# Patient Record
Sex: Male | Born: 1970 | Race: Asian | Hispanic: No | Marital: Married | Smoking: Never smoker
Health system: Southern US, Community
[De-identification: ages and names within clinical notes are randomized; demographics above are authoritative.]

## PROBLEM LIST (undated history)

## (undated) DIAGNOSIS — G4730 Sleep apnea, unspecified: Secondary | ICD-10-CM

## (undated) DIAGNOSIS — J45909 Unspecified asthma, uncomplicated: Secondary | ICD-10-CM

## (undated) DIAGNOSIS — R7303 Prediabetes: Secondary | ICD-10-CM

## (undated) DIAGNOSIS — M1990 Unspecified osteoarthritis, unspecified site: Secondary | ICD-10-CM

## (undated) DIAGNOSIS — I10 Essential (primary) hypertension: Secondary | ICD-10-CM

## (undated) DIAGNOSIS — K279 Peptic ulcer, site unspecified, unspecified as acute or chronic, without hemorrhage or perforation: Secondary | ICD-10-CM

## (undated) DIAGNOSIS — E559 Vitamin D deficiency, unspecified: Secondary | ICD-10-CM

## (undated) DIAGNOSIS — E669 Obesity, unspecified: Secondary | ICD-10-CM

## (undated) DIAGNOSIS — E785 Hyperlipidemia, unspecified: Secondary | ICD-10-CM

## (undated) HISTORY — DX: Vitamin D deficiency, unspecified: E55.9

## (undated) HISTORY — DX: Hyperlipidemia, unspecified: E78.5

## (undated) HISTORY — DX: Unspecified osteoarthritis, unspecified site: M19.90

## (undated) HISTORY — DX: Prediabetes: R73.03

## (undated) HISTORY — PX: KNEE SURGERY: SHX244

---

## 2001-11-25 ENCOUNTER — Encounter: Payer: Self-pay | Admitting: Orthopedic Surgery

## 2001-11-25 ENCOUNTER — Ambulatory Visit (HOSPITAL_COMMUNITY): Admission: RE | Admit: 2001-11-25 | Discharge: 2001-11-25 | Payer: Self-pay | Admitting: Orthopedic Surgery

## 2003-09-14 ENCOUNTER — Inpatient Hospital Stay (HOSPITAL_COMMUNITY): Admission: EM | Admit: 2003-09-14 | Discharge: 2003-09-16 | Payer: Self-pay | Admitting: Emergency Medicine

## 2004-01-06 ENCOUNTER — Ambulatory Visit (HOSPITAL_COMMUNITY): Admission: RE | Admit: 2004-01-06 | Discharge: 2004-01-06 | Payer: Self-pay | Admitting: Gastroenterology

## 2004-01-06 ENCOUNTER — Encounter (INDEPENDENT_AMBULATORY_CARE_PROVIDER_SITE_OTHER): Payer: Self-pay | Admitting: *Deleted

## 2004-01-27 ENCOUNTER — Ambulatory Visit (HOSPITAL_BASED_OUTPATIENT_CLINIC_OR_DEPARTMENT_OTHER): Admission: RE | Admit: 2004-01-27 | Discharge: 2004-01-27 | Payer: Self-pay | Admitting: Orthopedic Surgery

## 2007-05-15 ENCOUNTER — Ambulatory Visit: Payer: Self-pay | Admitting: Family Medicine

## 2007-05-15 DIAGNOSIS — Z8669 Personal history of other diseases of the nervous system and sense organs: Secondary | ICD-10-CM | POA: Insufficient documentation

## 2007-05-15 DIAGNOSIS — Z9189 Other specified personal risk factors, not elsewhere classified: Secondary | ICD-10-CM | POA: Insufficient documentation

## 2007-05-15 DIAGNOSIS — G4730 Sleep apnea, unspecified: Secondary | ICD-10-CM | POA: Insufficient documentation

## 2007-05-15 DIAGNOSIS — J309 Allergic rhinitis, unspecified: Secondary | ICD-10-CM | POA: Insufficient documentation

## 2007-05-15 DIAGNOSIS — Z8711 Personal history of peptic ulcer disease: Secondary | ICD-10-CM

## 2007-05-15 DIAGNOSIS — J45909 Unspecified asthma, uncomplicated: Secondary | ICD-10-CM | POA: Insufficient documentation

## 2007-05-19 ENCOUNTER — Ambulatory Visit: Payer: Self-pay | Admitting: Family Medicine

## 2007-05-19 LAB — CONVERTED CEMR LAB
Blood in Urine, dipstick: NEGATIVE
Glucose, Urine, Semiquant: NEGATIVE
WBC Urine, dipstick: NEGATIVE
pH: 7

## 2007-05-23 ENCOUNTER — Ambulatory Visit: Payer: Self-pay | Admitting: Pulmonary Disease

## 2007-05-26 LAB — CONVERTED CEMR LAB
Basophils Absolute: 0 10*3/uL (ref 0.0–0.1)
Bilirubin, Direct: 0.1 mg/dL (ref 0.0–0.3)
Calcium: 9.5 mg/dL (ref 8.4–10.5)
Cholesterol: 228 mg/dL (ref 0–200)
GFR calc Af Amer: 88 mL/min
HCT: 44.5 % (ref 39.0–52.0)
Hemoglobin: 15.2 g/dL (ref 13.0–17.0)
MCHC: 34.1 g/dL (ref 30.0–36.0)
Monocytes Absolute: 0.5 10*3/uL (ref 0.1–1.0)
Neutro Abs: 3.1 10*3/uL (ref 1.4–7.7)
RDW: 12.9 % (ref 11.5–14.6)
Sodium: 143 meq/L (ref 135–145)
TSH: 1.37 microintl units/mL (ref 0.35–5.50)
Total Bilirubin: 1 mg/dL (ref 0.3–1.2)
Total Protein: 7.4 g/dL (ref 6.0–8.3)

## 2007-06-01 ENCOUNTER — Encounter: Payer: Self-pay | Admitting: Pulmonary Disease

## 2007-06-01 ENCOUNTER — Ambulatory Visit (HOSPITAL_BASED_OUTPATIENT_CLINIC_OR_DEPARTMENT_OTHER): Admission: RE | Admit: 2007-06-01 | Discharge: 2007-06-01 | Payer: Self-pay | Admitting: Pulmonary Disease

## 2007-06-12 ENCOUNTER — Ambulatory Visit: Payer: Self-pay | Admitting: Pulmonary Disease

## 2007-06-13 ENCOUNTER — Ambulatory Visit: Payer: Self-pay | Admitting: Pulmonary Disease

## 2007-07-11 ENCOUNTER — Ambulatory Visit: Payer: Self-pay | Admitting: Pulmonary Disease

## 2007-08-13 ENCOUNTER — Encounter: Payer: Self-pay | Admitting: Pulmonary Disease

## 2007-10-01 ENCOUNTER — Encounter: Payer: Self-pay | Admitting: Family Medicine

## 2008-03-31 ENCOUNTER — Encounter: Payer: Self-pay | Admitting: Family Medicine

## 2008-08-06 ENCOUNTER — Encounter: Payer: Self-pay | Admitting: Family Medicine

## 2008-12-01 ENCOUNTER — Ambulatory Visit: Payer: Self-pay | Admitting: Family Medicine

## 2008-12-01 DIAGNOSIS — R51 Headache: Secondary | ICD-10-CM

## 2008-12-01 DIAGNOSIS — R519 Headache, unspecified: Secondary | ICD-10-CM | POA: Insufficient documentation

## 2008-12-05 ENCOUNTER — Emergency Department (HOSPITAL_COMMUNITY): Admission: EM | Admit: 2008-12-05 | Discharge: 2008-12-06 | Payer: Self-pay | Admitting: Emergency Medicine

## 2008-12-07 ENCOUNTER — Ambulatory Visit: Payer: Self-pay | Admitting: Family Medicine

## 2008-12-07 DIAGNOSIS — K2900 Acute gastritis without bleeding: Secondary | ICD-10-CM | POA: Insufficient documentation

## 2008-12-10 ENCOUNTER — Encounter (INDEPENDENT_AMBULATORY_CARE_PROVIDER_SITE_OTHER): Payer: Self-pay | Admitting: *Deleted

## 2009-01-28 ENCOUNTER — Encounter: Payer: Self-pay | Admitting: Family Medicine

## 2009-02-01 ENCOUNTER — Encounter: Payer: Self-pay | Admitting: Family Medicine

## 2010-02-21 NOTE — Letter (Signed)
Summary: [facility]  [facility]   Imported By: Maryln Gottron 02/21/2009 10:26:33  _____________________________________________________________________  External Attachment:    Type:   Image     Comment:   External Document

## 2010-04-26 LAB — COMPREHENSIVE METABOLIC PANEL
ALT: 40 U/L (ref 0–53)
AST: 30 U/L (ref 0–37)
Albumin: 4 g/dL (ref 3.5–5.2)
CO2: 28 mEq/L (ref 19–32)
Chloride: 107 mEq/L (ref 96–112)
GFR calc Af Amer: 41 mL/min — ABNORMAL LOW (ref >60)
GFR calc non Af Amer: 34 mL/min — ABNORMAL LOW (ref >60)
Potassium: 3.2 mEq/L — ABNORMAL LOW (ref 3.5–5.1)
Sodium: 141 mEq/L (ref 135–145)
Total Bilirubin: 0.6 mg/dL (ref 0.3–1.2)

## 2010-04-26 LAB — LIPASE, BLOOD: Lipase: 31 U/L (ref 11–59)

## 2010-04-26 LAB — URINALYSIS, ROUTINE W REFLEX MICROSCOPIC
Bilirubin Urine: NEGATIVE
Ketones, ur: NEGATIVE mg/dL
Nitrite: NEGATIVE
Protein, ur: NEGATIVE mg/dL
Specific Gravity, Urine: 1.008 (ref 1.005–1.030)
Urobilinogen, UA: 0.2 mg/dL (ref 0.0–1.0)

## 2010-04-26 LAB — CBC
Hemoglobin: 14.3 g/dL (ref 13.0–17.0)
RDW: 14 % (ref 11.5–15.5)

## 2010-04-26 LAB — DIFFERENTIAL
Basophils Absolute: 0 10*3/uL (ref 0.0–0.1)
Lymphocytes Relative: 29 % (ref 12–46)
Monocytes Absolute: 0.9 10*3/uL (ref 0.1–1.0)
Neutro Abs: 5.8 10*3/uL (ref 1.7–7.7)
Neutrophils Relative %: 60 % (ref 43–77)

## 2010-04-26 LAB — HEMOCCULT GUIAC POC 1CARD (OFFICE): Fecal Occult Bld: NEGATIVE

## 2010-04-26 LAB — URINE CULTURE

## 2010-04-26 LAB — PROTIME-INR
INR: 1.04 (ref 0.00–1.49)
Prothrombin Time: 13.5 seconds (ref 11.6–15.2)

## 2010-04-26 LAB — APTT: aPTT: 31 seconds (ref 24–37)

## 2010-06-06 NOTE — Procedures (Signed)
[name], Stephen Webster NO.:  [id number]   MEDICAL RECORD NO.:  [id number]          PATIENT TYPE:  OUT   LOCATION:  SLEEP CENTER                 FACILITY:  [facility]   PHYSICIAN:  Barbaraann Share, MD,FCCPDATE OF BIRTH:  Dec 09[birth year] 1969   DATE OF STUDY:  06/01/2007                            NOCTURNAL POLYSOMNOGRAM   REFERRING PHYSICIAN:  Barbaraann Share, MD,FCCP   INDICATION FOR STUDY:  Hypersomnia with sleep apnea.   EPWORTH SLEEPINESS SCORE:  7.   MEDICATIONS:   SLEEP ARCHITECTURE:  The patient had a total sleep time of 365 minutes,  with no slow-wave sleep, and decreased quantities of REM.  Sleep onset  latency was prolonged at 66 minutes, and REM onset was normal.  Sleep  efficiency was decreased at 82%.   RESPIRATORY DATA:  The patient was found to have 563 apneas and 80  hypopneas, for an apnea/hypopnea index of 106 events/hour.  The events  were not positional, and there was very loud snoring noted throughout.   OXYGEN DATA:  The patient had O2 desaturation as low as 59%, with the  worst being during his REM events.  The patient spent 193 minutes less  than 88% saturation during the study.   CARDIAC DATA:  No clinically significant arrhythmias were noted.   MOVEMENT-PARASOMNIA:  The patient had no significant leg jerks or  abnormal behaviors.   IMPRESSIONS-RECOMMENDATIONS:  Very severe obstructive sleep  apnea/hypopnea syndrome, with an apnea/hypopnea index of 106 events/hour  and O2 desaturation as low as 59%.  Treatment for this degree of sleep  apnea should focus primarily on weight loss as well as CPAP.      Barbaraann Share, MD,FCCP  Diplomate, American Board of Sleep  Medicine  Electronically Signed     KMC/MEDQ  D:  06/13/2007 06:12:20  T:  06/13/2007 07:17:56  Job:  [id number]

## 2010-06-09 NOTE — Discharge Summary (Signed)
Stephen Webster, Stephen Webster                           ACCOUNT NO.:  [id number]   MEDICAL RECORD NO.:  [id number]                   PATIENT TYPE:  INP   LOCATION:  5741                                 FACILITY:  [facility]   PHYSICIAN:  Melissa L. Ladona Ridgel, MD               DATE OF BIRTH:  1970-10-02   DATE OF ADMISSION:  09/14/2003  DATE OF DISCHARGE:  09/16/2003                                 DISCHARGE SUMMARY   DISCHARGE DIAGNOSES:  1. Upper gastrointestinal bleed secondary to a CLO-positive ulcer noted on     esophagogastroduodenoscopy.  2. Slight anemia related to #1.  3. Possible sleep apnea as a source for his repeated headaches.   DISCHARGE MEDICATIONS:  1. Protonix 40 mg p.o. b.i.d. x 10 days and then decrease this down to q.d.  2. Biaxin 500 mg p.o. b.i.d. x 10 days.  3. Amoxicillin 1 gm p.o. b.i.d.   SPECIAL INSTRUCTIONS:  The patient is to locate a primary care physician  through his insurance company to arrange for a follow-up visit and possible  sleep study to rule out sleep apnea as his source for chronic headache.   FOLLOW UP:  The patient is to follow up with Dr. Bernette Redbird on September 28, 2003.   LABORATORY DATA:  CT of the head is within normal limits.   HISTORY OF PRESENT ILLNESS:  The patient is a 40 year old [place] male who  presented to the hospital on the day of admission with symptoms of nausea,  vomiting of coffee ground material, and dark stools, as well as left  abdominal pain.  The patient was seen in the emergency room by GI and  underwent EGD at which time was found to have a nonbleeding ulcer located in  the antrum.  Since there was no active bleeding, no intervention was  undertaken.   The patient was monitored overnight in the stepdown unit and treated with  Protonix as well as sucralfate.  His hemoglobin remained stable throughout  the course of the hospitalization.  He was able to resume p.o. intake  without nausea, vomiting, or abdominal  pain.   The CLO-test performed on the ulcer came back positive and he was therefore  started on antibacterial therapy for H. pylori.   PHYSICAL EXAMINATION:  VITAL SIGNS:  On the day of discharge, the patient's  vital signs were stable.  Blood pressure was 100/57, pulse 77, respirations  16, temperature 98 with 99% on room air.  GENERAL:  His general exam revealed no acute distress.  HEENT:  Pupils are equal, round, and reactive to light.  Extraocular  movements intact.  Moist mucus membranes.  NECK:  Supple.  There was no JVD, no lymph nodes, and no carotid bruits.  CHEST:  Clear to auscultation.  CARDIOVASCULAR:  Regular rate and rhythm.  Positive S1 and S2.  No S3, S4,  murmurs, rubs, or gallops.  ABDOMEN:  Soft, nontender, and nondistended with positive bowel sounds.  EXTREMITIES:  No clubbing, cyanosis, or edema.  NEUROLOGICAL:  He was nonfocal.   HOSPITAL COURSE:  The patient had described frequent headaches, temporal in  nature.  No evidence for muscle spasm in the neck. He described these as  being daily for which he had been taking Naprosyn which may have contributed  to this GI bleed.  Because of this complaint, we did take a CAT scan of his  head which was found to be within normal limits. Of note, during his  hospitalization, the patient was noted to have signs and symptoms of sleep  apnea and therefore was recommended that he follow up with a primary care  physician to have a sleep study.  This may be contributing to his daily  headaches.   Pertinent laboratory values reveal a discharge hemoglobin and hematocrit of  9.5 and 28.1 which was stable over 48 hours. His BUN and creatinine were  stable at 13 and 1.2.  Also done during this hospitalization were liver  enzymes which were within normal limits.   The patient was found to be stable for discharge and requested to follow up  with a primary care physician as well as Dr. Bernette Redbird.                                                 Melissa L. Ladona Ridgel, MD    MLT/MEDQ  D:  09/17/2003  T:  09/17/2003  Job:  [id number]   cc:   Bernette Redbird, M.D.  [address] [place]  [place], [place] [address]  Fax: [phone]

## 2010-06-09 NOTE — Op Note (Signed)
NAMEZAYVEN, Stephen Webster               ACCOUNT NO.:  [id number]   MEDICAL RECORD NO.:  [id number]          PATIENT TYPE:  AMB   LOCATION:  ENDO                         FACILITY:  [facility]   PHYSICIAN:  Bernette Redbird, M.D.   DATE OF BIRTH:  Jun 07, 1970   DATE OF PROCEDURE:  DATE OF DISCHARGE:                                 OPERATIVE REPORT   DATE OF PROCEDURE:  January 06, 2004.   PROCEDURE:  Upper endoscopy with biopsies.   INDICATION:  A 40 year old gentleman now four months status post an upper GI  bleed from a gastric ulcer.  At that time, he had been on NSAIDs, but was  also H. pylori positive, and he has been treated for the H. pylori infection  since that time.   ENDOSCOPIST:  Bernette Redbird, MD.   FINDINGS:  Resolution of gastric ulcer.   PROCEDURE:  The nature, purpose, and risks of the procedure were familiar to  the patient from prior examination, and he provided written consent.  Sedation was Fentanyl 60 mcg and Versed 6 mg IV with some desaturation  initially related to snoring and probably some upper airway obstruction,  which responded to positioning of his head and lifting his chin.  The  Olympus small-caliber adult video endoscope was passed under direct vision.  The vocal cords were unremarkable.  The esophagus was readily entered and  had normal mucosa without evidence of reflux esophagitis, Barrett's  esophagus, varices, infection, neoplasia, Mallory-Weiss tear, or any ring  stricture or significant hiatal hernia.   The stomach contained no significant residual.  In the antral region was a  patch of punctate erythema probably responding to the previous ulcer site,  which was now completely healed.  No ulcers, erosions, polyps, or masses  were observed in the stomach including on a retroflexed view of the proximal  stomach, and the pylorus, duodenal bulb, and second duodenum were  unremarkable.   Antral biopsies were obtained to look for evidence of residual H.  pylori  infection status post treatment, and the scope was then removed from the  patient, who tolerated the procedure well and without apparent complication.   IMPRESSION:  Resolution of gastric ulcer (531.00).   PLAN:  Await pathology on biopsies looking for H. pylori infection and  consider re-treatment if positive.  Okay to remain off PPI therapy, unless  he goes back on aspirin or NSAIDs in the future for some reason.       RB/MEDQ  D:  01/06/2004  T:  01/06/2004  Job:  [id number]   cc:   [facility]

## 2010-06-09 NOTE — Consult Note (Signed)
Stephen, Webster NO.:  [id number]   MEDICAL RECORD NO.:  [id number]                   PATIENT TYPE:  INP   LOCATION:  1825                                 FACILITY:  [facility]   PHYSICIAN:  Stephen Webster, M.D.                DATE OF BIRTH:  [birth year]   DATE OF CONSULTATION:  09/14/2003  DATE OF DISCHARGE:                                   CONSULTATION   REASON FOR CONSULTATION:  Stephen Webster of the [facility] Service asked  me to see this 40 year old, unassigned patient because of GI bleeding.   HISTORY OF PRESENT ILLNESS:  Stephen Webster is a Stephen Webster.  He is originally from [place] himself.  He has no prior  history of ulcer disease or GI bleeding.  A couple of weeks ago he was  started on Naprosyn, I believe by the [facility], for headaches.  Shortly thereafter, he began to notice dark stools and some left perigastric  abdominal pain.  Today, he had coffee ground emesis apparently admixed with  some actual red blood.  He presented to the emergency room where he was  noted to have a systolic blood pressure in the 80s, but this came up nicely  with fluids.  His initial hemoglobin is 12.8.  In view of the findings,  arrangements were made for inpatient care on the hospitalist service and I  was asked to see the patient consultatively.   PAST MEDICAL HISTORY:  1. No known allergies.  2. No chronic medications (recently on Naprosyn per HPI).  3. Operations:  None.  4. Chronic medical illnesses:  None.   FAMILY HISTORY:  Negative for GI illnesses.   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   REVIEW OF SYMPTOMS:  Negative for chronic GI symptoms, specifically no  antecedent anorexia, weight loss, chronic reflux, dysphagia, abdominal pain,  or bowel habit problems.   PHYSICAL EXAMINATION:  GENERAL:  This is a stocky, pleasant,  articulate,  Asian gentleman who speaks good [place].  VITAL SIGNS:  Vital signs at this  time are unremarkable with pulse around 99 and normal blood pressure.  HEENT:  He is anicteric and has perhaps a hint of skin pallor.  CHEST:  Clear.  HEART: Normal.  ABDOMEN:  Without evident mass or tenderness.   LABORATORY DATA:  Pertinent for initial normal hemoglobin of 12.8.  Elevated  BUN 45 with creatinine 1.2.  Normal liver chemistries, lipase, platelets,  and INR.   IMPRESSION:  1. Hematemesis with mild hemodynamic instability but so far, no post     hemorrhagic anemia.  2. Antecedent melenic stools suggesting some low-grade gastrointestinal     bleeding prior to today.  3. Nonsteroidal anti-inflammatory drugs exposure, presumably accounting for     the gastrointestinal bleed.   PLAN:  Endoscopic evaluation.  The nature, purpose, and risks of the  procedure were reviewed with the patient.  We will proceed with it this  evening so as to determine, as early as possible, whether the patient is  bleeding or at significant risk for further bleeding.  Further management  would depend in part on the endoscopic findings.                                               Stephen Webster, M.D.    RB/MEDQ  D:  09/14/2003  T:  09/15/2003  Job:  [id number]   cc:   [facility]

## 2010-06-09 NOTE — Op Note (Signed)
NAMESUFIAN, Stephen Webster NO.:  [id number]   MEDICAL RECORD NO.:  [id number]                   PATIENT TYPE:  INP   LOCATION:  1825                                 FACILITY:  [facility]   PHYSICIAN:  Bernette Redbird, M.D.                DATE OF BIRTH:  1970/09/21   DATE OF PROCEDURE:  09/14/2003  DATE OF DISCHARGE:                                 OPERATIVE REPORT   PROCEDURE:  Upper endoscopy.   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  Shortly thereafter he began to have dark stools, and today  he had coffee-grounds emesis and hematemesis and presented to the emergency  room with a systolic blood pressure of approximately 80, which came up  nicely with fluids.   FINDINGS:  Antral ulcer without active bleeding at the time of the exam.   PROCEDURE:  The nature, purpose, and risks of the procedure had been  discussed with the patient, who provided written consent.  The procedure was  done at the bedside in the emergency room.  Sedation was fentanyl 50 mcg and  Versed 5 mg IV without arrhythmias or desaturation.  The Olympus video  endoscope was passed under direct vision.  The vocal cords looked normal.  There was a little bit of thickening of the posterior commissure of the  larynx, which is of uncertain clinical significance.   The esophagus was readily entered and was normal in its entirety without  evidence of reflux esophagitis, Barrett's esophagus, varices, infection,  neoplasia, or any ring, stricture, or hiatal hernia.   The stomach contained a small bilious residual, no blood or coffee-grounds  material whatsoever.   The antrum of the stomach had a moderately large benign-appearing ulcer  measuring about 3 x 4 cm in size.  The margin of the ulcer was somewhat  edematous but not really heaped up.  There was no significant mass effect.  The base of the ulcer had a slightly dirty appearance  consistent with the  stigma of recent hemorrhage, but there was no discrete visible vessel and  certainly no active bleeding or adherent clot.  The remainder of the stomach  was normal, including a retroflexed view of the proximal stomach.  Similarly, the pylorus, duodenal bulb, and second duodenum looked normal.   Several antral biopsies were obtained for CLOtesting prior to removal of the  scope, but I did not biopsy the ulcer in view of the recent bleeding.   The patient tolerated the procedure well, and there were no apparent  complications.   IMPRESSION:  1. No active bleeding or blood in the stomach at the time of this     examination.  2. Moderately large, benign-appearing gastric antral ulcer consistent with     nonsteroidal anti-inflammatory drug exposure, consistent with recent     hemorrhage but no visible vessel, therefore no intervention performed     (  531.00).   RECOMMENDATIONS:  I have discussed the findings with Dr. Tresa Endo of the  [facility].  I would favor aggressive antipeptic therapy,  awaiting CLOtest results, treating the CLOtest if it is positive for H.  pylori infection, and performing repeat endoscopy in a couple of months to  confirm healing.                                               Bernette Redbird, M.D.    RB/MEDQ  D:  09/14/2003  T:  09/15/2003  Job:  [id number]   cc:   [facility]

## 2010-06-09 NOTE — Op Note (Signed)
Stephen Webster, Stephen Webster               ACCOUNT NO.:  [id number]   MEDICAL RECORD NO.:  [id number]          PATIENT TYPE:  AMB   LOCATION:  DSC                          FACILITY:  [facility]   PHYSICIAN:  Loreta Ave, M.D. DATE OF BIRTH:  02/28/1970   DATE OF PROCEDURE:  01/27/2004  DATE OF DISCHARGE:                                 OPERATIVE REPORT   PREOPERATIVE DIAGNOSES:  Right knee loose body.   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   OPERATION PERFORMED:  Right knee examination under anesthesia and  arthroscopy with extensive lysis and debridement of adhesions and reactive  synovitis.   SURGEON:  Loreta Ave, M.D.   ASSISTANT:  Genene Churn. Denton Meek.   ANESTHESIA:  General.   ESTIMATED BLOOD LOSS:  Minimal.   TOURNIQUET:  Not employed.   SPECIMENS:  None.   CULTURES:  None.   COMPLICATIONS:  None.   DRESSING:  Soft compressive.   DESCRIPTION OF PROCEDURE:  The patient was brought to the operating room and  after adequate anesthesia had been obtained, the right knee examined.  Full  motion.  Stable ligaments, but tethering of the patellofemoral joint  relatively globally which turned out to be from adhesions.  Tourniquet and  leg holder applied.  Leg prepped and draped in the usual sterile fashion.  Three portals were created, one superolateral, one each medial and lateral  parapatellar.  Inflow catheter introduced.  Knee distended.  Arthroscope  introduced, knee inspected.  Articular cartilage intact.  Tethering  globally, patellofemoral joint because of extensive adhesions anteriorly.  Area on MRI  suggesting loose bodies, turned out to be marked adhesions  within the notch.  All of the reactive synovitis and adhesions debrided.  Where they extended over into the medial and lateral gutter where they could  cause mechanical symptoms on the margin of the  condyle, they were all  removed re-establishing the medial and lateral gutters.  Excellent look in  all gutters and all recesses.  Cruciate ligaments intact and no loose bodies  seen.  No chondral defects which would cause loose bodies either.  Medial  and lateral meniscus were intact.  At completion, better  patellofemoral mobility and no tethering.  Entire knee examined, no other  findings appreciated.  Instruments and fluid removed.  Portals of the knee  injected with Marcaine.  Portals closed with 4-0 nylon.  Sterile compressive  dressing applied.  Anesthesia reversed.  Brought to recovery room.  Tolerated surgery well.  No complications.      [name]   DFM/MEDQ  D:  01/27/2004  T:  01/27/2004  Job:  [id number]

## 2010-08-16 ENCOUNTER — Other Ambulatory Visit: Payer: Self-pay | Admitting: Gastroenterology

## 2010-08-18 ENCOUNTER — Ambulatory Visit
Admission: RE | Admit: 2010-08-18 | Discharge: 2010-08-18 | Disposition: A | Discharge status: Home / Self Care | Payer: BC Managed Care – PPO | Source: Ambulatory Visit | Attending: Gastroenterology | Admitting: Gastroenterology

## 2011-07-05 ENCOUNTER — Emergency Department (HOSPITAL_COMMUNITY): Payer: BC Managed Care – PPO

## 2011-07-05 ENCOUNTER — Encounter (HOSPITAL_COMMUNITY): Payer: Self-pay | Admitting: Emergency Medicine

## 2011-07-05 ENCOUNTER — Emergency Department (HOSPITAL_COMMUNITY)
Admission: EM | Admit: 2011-07-05 | Discharge: 2011-07-06 | Disposition: A | Discharge status: Home / Self Care | Payer: BC Managed Care – PPO | Attending: Emergency Medicine | Admitting: Emergency Medicine

## 2011-07-05 DIAGNOSIS — M545 Low back pain, unspecified: Secondary | ICD-10-CM | POA: Insufficient documentation

## 2011-07-05 DIAGNOSIS — IMO0002 Reserved for concepts with insufficient information to code with codable children: Secondary | ICD-10-CM | POA: Insufficient documentation

## 2011-07-05 DIAGNOSIS — T148XXA Other injury of unspecified body region, initial encounter: Secondary | ICD-10-CM

## 2011-07-05 DIAGNOSIS — Z79899 Other long term (current) drug therapy: Secondary | ICD-10-CM | POA: Insufficient documentation

## 2011-07-05 DIAGNOSIS — Y92009 Unspecified place in unspecified non-institutional (private) residence as the place of occurrence of the external cause: Secondary | ICD-10-CM | POA: Insufficient documentation

## 2011-07-05 DIAGNOSIS — M549 Dorsalgia, unspecified: Secondary | ICD-10-CM

## 2011-07-05 DIAGNOSIS — I10 Essential (primary) hypertension: Secondary | ICD-10-CM | POA: Insufficient documentation

## 2011-07-05 DIAGNOSIS — X500XXA Overexertion from strenuous movement or load, initial encounter: Secondary | ICD-10-CM | POA: Insufficient documentation

## 2011-07-05 DIAGNOSIS — E669 Obesity, unspecified: Secondary | ICD-10-CM | POA: Insufficient documentation

## 2011-07-05 HISTORY — DX: Essential (primary) hypertension: I10

## 2011-07-05 HISTORY — DX: Sleep apnea, unspecified: G47.30

## 2011-07-05 HISTORY — DX: Obesity, unspecified: E66.9

## 2011-07-05 HISTORY — DX: Peptic ulcer, site unspecified, unspecified as acute or chronic, without hemorrhage or perforation: K27.9

## 2011-07-05 NOTE — ED Notes (Signed)
PT. REPORTS LOW BACK PAIN ONSET LAST WEEK AFTER LIFTING HIS DAUGHTER , PAIN WORSE WITH MOVEMENT AND CERTAIN POSITIONS .

## 2011-07-06 MED ORDER — OXYCODONE-ACETAMINOPHEN 5-325 MG PO TABS: 1.0000 | ORAL_TABLET | Freq: Four times a day (QID) | ORAL | Status: AC | PRN

## 2011-07-06 MED ORDER — DIAZEPAM 5 MG PO TABS
5.0000 mg | ORAL_TABLET | Freq: Four times a day (QID) | ORAL | Status: AC | PRN
Start: 2011-07-06 — End: 2011-07-16

## 2011-07-06 NOTE — Discharge Instructions (Signed)
You were seen and evaluated for your complaints of low back pains and soreness. Your x-rays today did not show any signs for broken bones or other concerning injuries. At this time your providers that he may return home and followup to primary care provider. It is recommended that he discuss with your primary care provider and need for further evaluation such as MRI testing. If you develop any worsening symptoms, severe pain, weakness in your legs, loss of control of your bowels or bladder, difficulty urinating or numbness in your groin return to emergency room.   Back Pain, Adult Low back pain is very common. About 1 in 5 people have back pain.The cause of low back pain is rarely dangerous. The pain often gets better over time.About half of people with a sudden onset of back pain feel better in just 2 weeks. About 8 in 10 people feel better by 6 weeks.  CAUSES Some common causes of back pain include:  Strain of the muscles or ligaments supporting the spine.   Wear and tear (degeneration) of the spinal discs.   Arthritis.   Direct injury to the back.  DIAGNOSIS Most of the time, the direct cause of low back pain is not known.However, back pain can be treated effectively even when the exact cause of the pain is unknown.Answering your caregiver's questions about your overall health and symptoms is one of the most accurate ways to make sure the cause of your pain is not dangerous. If your caregiver needs more information, he or she may order lab work or imaging tests (X-rays or MRIs).However, even if imaging tests show changes in your back, this usually does not require surgery. HOME CARE INSTRUCTIONS For many people, back pain returns.Since low back pain is rarely dangerous, it is often a condition that people can learn to [facility] their own.   Remain active. It is stressful on the back to sit or stand in one place. Do not sit, drive, or stand in one place for more than 30 minutes at a time.  Take short walks on level surfaces as soon as pain allows.Try to increase the length of time you walk each day.   Do not stay in bed.Resting more than 1 or 2 days can delay your recovery.   Do not avoid exercise or work.Your body is made to move.It is not dangerous to be active, even though your back may hurt.Your back will likely heal faster if you return to being active before your pain is gone.   Pay attention to your body when you bend and lift. Many people have less discomfortwhen lifting if they bend their knees, keep the load close to their bodies,and avoid twisting. Often, the most comfortable positions are those that put less stress on your recovering back.   Find a comfortable position to sleep. Use a firm mattress and lie on your side with your knees slightly bent. If you lie on your back, put a pillow under your knees.   Only take over-the-counter or prescription medicines as directed by your caregiver. Over-the-counter medicines to reduce pain and inflammation are often the most helpful.Your caregiver may prescribe muscle relaxant drugs.These medicines help dull your pain so you can more quickly return to your normal activities and healthy exercise.   Put ice on the injured area.   Put ice in a plastic bag.   Place a towel between your skin and the bag.   Leave the ice on for 15 to 20 minutes, 3 to  4 times a day for the first 2 to 3 days. After that, ice and heat may be alternated to reduce pain and spasms.   Ask your caregiver about trying back exercises and gentle massage. This may be of some benefit.   Avoid feeling anxious or stressed.Stress increases muscle tension and can worsen back pain.It is important to recognize when you are anxious or stressed and learn ways to manage it.Exercise is a great option.  SEEK MEDICAL CARE IF:  You have pain that is not relieved with rest or medicine.   You have pain that does not improve in 1 week.   You have new  symptoms.   You are generally not feeling well.  SEEK IMMEDIATE MEDICAL CARE IF:   You have pain that radiates from your back into your legs.   You develop new bowel or bladder control problems.   You have unusual weakness or numbness in your arms or legs.   You develop nausea or vomiting.   You develop abdominal pain.   You feel faint.  Document Released: 01/08/2005 Document Revised: 12/28/2010 Document Reviewed: 05/29/2010 [facility] Patient Information 2012 [place], [place].     Back Exercises Back exercises help treat and prevent back injuries. The goal of back exercises is to increase the strength of your abdominal and back muscles and the flexibility of your back. These exercises should be started when you no longer have back pain. Back exercises include:  Pelvic Tilt. Lie on your back with your knees bent. Tilt your pelvis until the lower part of your back is against the floor. Hold this position 5 to 10 sec and repeat 5 to 10 times.   Knee to Chest. Pull first 1 knee up against your chest and hold for 20 to 30 seconds, repeat this with the other knee, and then both knees. This may be done with the other leg straight or bent, whichever feels better.   Sit-Ups or Curl-Ups. Bend your knees 90 degrees. Start with tilting your pelvis, and do a partial, slow sit-up, lifting your trunk only 30 to 45 degrees off the floor. Take at least 2 to 3 seconds for each sit-up. Do not do sit-ups with your knees out straight. If partial sit-ups are difficult, simply do the above but with only tightening your abdominal muscles and holding it as directed.   Hip-Lift. Lie on your back with your knees flexed 90 degrees. Push down with your feet and shoulders as you raise your hips a couple inches off the floor; hold for 10 seconds, repeat 5 to 10 times.   Back arches. Lie on your stomach, propping yourself up on bent elbows. Slowly press on your hands, causing an arch in your low back. Repeat 3 to 5  times. Any initial stiffness and discomfort should lessen with repetition over time.   Shoulder-Lifts. Lie face down with arms beside your body. Keep hips and torso pressed to floor as you slowly lift your head and shoulders off the floor.  Do not overdo your exercises, especially in the beginning. Exercises may cause you some mild back discomfort which lasts for a few minutes; however, if the pain is more severe, or lasts for more than 15 minutes, do not continue exercises until you see your caregiver. Improvement with exercise therapy for back problems is slow.  See your caregivers for assistance with developing a proper back exercise program. Document Released: 02/16/2004 Document Revised: 12/28/2010 Document Reviewed: 01/08/2005 [facility] Patient Information 2012 [place], [place].

## 2011-07-06 NOTE — ED Provider Notes (Signed)
History     CSN: [id number]  Arrival date & time 07/05/11  2101   First MD Initiated Contact with Patient 07/05/11 2301      Chief Complaint  Patient presents with  . Back Pain   HPI  History provided by the patient. Patient is a 41 year old male with history of hypertension and peptic ulcer disease who presents with complaints of low back pain and strain. Patient states that he was lifting his daughter last week and carry her from the car to the garage after returning home and after sitting her down the ground felt some soreness in his low back area. Symptoms persisted with waxing waning course for a few days but have become much more steady and persistent. Patient has been trying to use heat over the area as well as over-the-counter pain medications without significant relief. Tonight pain became much more severe with any positions or movements. Patient states he has slept poorly last few nights and especially this night. Pain does radiate some into the buttocks. Patient has no other complaints or symptoms. Denies any numbness weakness in lower extremities. Denies any urinary or fecal incontinence, urinary retention or perineal numbness.   Past Medical History  Diagnosis Date  . Hypertension   . PUD (peptic ulcer disease)   . Sleep apnea   . Obesity     Past Surgical History  Procedure Date  . Knee surgery     No family history on file.  History  Substance Use Topics  . Smoking status: Never Smoker   . Smokeless tobacco: Not on file  . Alcohol Use: No      Review of Systems  Constitutional: Negative for fever and chills.  Genitourinary: Negative for dysuria, frequency, hematuria, flank pain and difficulty urinating.  Musculoskeletal: Positive for back pain.  Neurological: Negative for weakness and numbness.    Allergies  Aspirin and Nsaids  Home Medications   Current Outpatient Rx  Name Route Sig Dispense Refill  . LEVOCETIRIZINE DIHYDROCHLORIDE 5 MG PO TABS  Oral Take 5 mg by mouth every evening.    Marland Kitchen LORATADINE 10 MG PO TABS Oral Take 10 mg by mouth daily.      BP 133/77  Pulse 100  Temp 98.2 F (36.8 C) (Oral)  Resp 18  SpO2 94%  Physical Exam  Nursing note and vitals reviewed. Constitutional: He is oriented to person, place, and time. He appears well-developed and well-nourished. No distress.  HENT:  Head: Normocephalic.  Cardiovascular: Normal rate and regular rhythm.   Pulmonary/Chest: Effort normal and breath sounds normal. No respiratory distress. He has no wheezes. He has no rales.  Abdominal: Soft. He exhibits no distension. There is no tenderness. There is no rebound and no guarding.       No CVA tenderness  Musculoskeletal:       Cervical back: Normal.       Thoracic back: Normal.       Lumbar back: He exhibits tenderness. He exhibits no bony tenderness.       Back:  Neurological: He is alert and oriented to person, place, and time. Gait normal.  Skin: Skin is warm. No rash noted.  Psychiatric: He has a normal mood and affect. His behavior is normal.    ED Course  Procedures   Dg Lumbar Spine Complete  07/05/2011  *RADIOLOGY REPORT*  Clinical Data: Low back pain, lifting injury  LUMBAR SPINE - COMPLETE 4+ VIEW  Comparison: 12/06/2008  Findings: Normal alignment.  No compression  fracture.  Preserved vertebral body heights and disc spaces.  No significant degenerative process or spondylosis.  Normal SI joints.  Intact pedicles.  IMPRESSION: No acute finding  Original Report Authenticated By: Judie Petit. Ruel Favors, M.D.     1. Back pain   2. Muscle strain       MDM  Patient seen and evaluated. Patient no acute distress. Patient with no concerning or retroflex symptoms for back pain. Patient has had a baseline chronic back pain with flareup after lifting daughter        Angus Seller, [place] 07/06/11 [phone]

## 2011-07-08 NOTE — ED Provider Notes (Signed)
Medical screening examination/treatment/procedure(s) were performed by non-physician practitioner and as supervising physician I was immediately available for consultation/collaboration.  Lolamae Voisin, MD 07/08/11 1932 

## 2012-05-19 ENCOUNTER — Ambulatory Visit (HOSPITAL_COMMUNITY)
Admission: RE | Admit: 2012-05-19 | Discharge: 2012-05-19 | Disposition: A | Discharge status: Home / Self Care | Payer: BC Managed Care – PPO | Source: Ambulatory Visit | Attending: Internal Medicine | Admitting: Internal Medicine

## 2012-05-19 ENCOUNTER — Other Ambulatory Visit (HOSPITAL_COMMUNITY): Payer: Self-pay | Admitting: Internal Medicine

## 2012-05-19 DIAGNOSIS — M2560 Stiffness of unspecified joint, not elsewhere classified: Secondary | ICD-10-CM | POA: Insufficient documentation

## 2012-05-19 DIAGNOSIS — S139XXA Sprain of joints and ligaments of unspecified parts of neck, initial encounter: Secondary | ICD-10-CM

## 2012-05-19 DIAGNOSIS — R209 Unspecified disturbances of skin sensation: Secondary | ICD-10-CM | POA: Insufficient documentation

## 2012-12-23 ENCOUNTER — Other Ambulatory Visit: Payer: Self-pay

## 2013-02-09 DIAGNOSIS — M1990 Unspecified osteoarthritis, unspecified site: Secondary | ICD-10-CM | POA: Insufficient documentation

## 2013-02-09 DIAGNOSIS — E559 Vitamin D deficiency, unspecified: Secondary | ICD-10-CM | POA: Insufficient documentation

## 2013-02-09 DIAGNOSIS — R7303 Prediabetes: Secondary | ICD-10-CM | POA: Insufficient documentation

## 2013-02-09 DIAGNOSIS — E785 Hyperlipidemia, unspecified: Secondary | ICD-10-CM | POA: Insufficient documentation

## 2013-02-09 DIAGNOSIS — I10 Essential (primary) hypertension: Secondary | ICD-10-CM | POA: Insufficient documentation

## 2013-02-10 ENCOUNTER — Ambulatory Visit: Payer: Self-pay | Admitting: Physician Assistant

## 2013-03-14 ENCOUNTER — Emergency Department (HOSPITAL_COMMUNITY)
Admission: EM | Admit: 2013-03-14 | Discharge: 2013-03-14 | Disposition: A | Discharge status: Home / Self Care | Payer: BC Managed Care – PPO | Attending: Emergency Medicine | Admitting: Emergency Medicine

## 2013-03-14 ENCOUNTER — Other Ambulatory Visit: Payer: Self-pay

## 2013-03-14 ENCOUNTER — Emergency Department (HOSPITAL_COMMUNITY): Payer: BC Managed Care – PPO

## 2013-03-14 ENCOUNTER — Encounter (HOSPITAL_COMMUNITY): Payer: Self-pay | Admitting: Emergency Medicine

## 2013-03-14 DIAGNOSIS — W08XXXA Fall from other furniture, initial encounter: Secondary | ICD-10-CM | POA: Insufficient documentation

## 2013-03-14 DIAGNOSIS — R7309 Other abnormal glucose: Secondary | ICD-10-CM | POA: Insufficient documentation

## 2013-03-14 DIAGNOSIS — Z888 Allergy status to other drugs, medicaments and biological substances status: Secondary | ICD-10-CM | POA: Insufficient documentation

## 2013-03-14 DIAGNOSIS — S86911A Strain of unspecified muscle(s) and tendon(s) at lower leg level, right leg, initial encounter: Secondary | ICD-10-CM

## 2013-03-14 DIAGNOSIS — Z79899 Other long term (current) drug therapy: Secondary | ICD-10-CM | POA: Insufficient documentation

## 2013-03-14 DIAGNOSIS — Y929 Unspecified place or not applicable: Secondary | ICD-10-CM | POA: Insufficient documentation

## 2013-03-14 DIAGNOSIS — M129 Arthropathy, unspecified: Secondary | ICD-10-CM | POA: Insufficient documentation

## 2013-03-14 DIAGNOSIS — M25469 Effusion, unspecified knee: Secondary | ICD-10-CM | POA: Insufficient documentation

## 2013-03-14 DIAGNOSIS — R55 Syncope and collapse: Secondary | ICD-10-CM | POA: Insufficient documentation

## 2013-03-14 DIAGNOSIS — G4730 Sleep apnea, unspecified: Secondary | ICD-10-CM | POA: Insufficient documentation

## 2013-03-14 DIAGNOSIS — E669 Obesity, unspecified: Secondary | ICD-10-CM | POA: Insufficient documentation

## 2013-03-14 DIAGNOSIS — K279 Peptic ulcer, site unspecified, unspecified as acute or chronic, without hemorrhage or perforation: Secondary | ICD-10-CM | POA: Insufficient documentation

## 2013-03-14 DIAGNOSIS — Y93H9 Activity, other involving exterior property and land maintenance, building and construction: Secondary | ICD-10-CM | POA: Insufficient documentation

## 2013-03-14 DIAGNOSIS — IMO0002 Reserved for concepts with insufficient information to code with codable children: Secondary | ICD-10-CM | POA: Insufficient documentation

## 2013-03-14 LAB — CBC
HEMATOCRIT: 42.9 % (ref 39.0–52.0)
HEMOGLOBIN: 14.8 g/dL (ref 13.0–17.0)
MCH: 30.1 pg (ref 26.0–34.0)
MCHC: 34.5 g/dL (ref 30.0–36.0)
MCV: 87.4 fL (ref 78.0–100.0)
Platelets: 188 10*3/uL (ref 150–400)
RBC: 4.91 MIL/uL (ref 4.22–5.81)
RDW: 13.7 % (ref 11.5–15.5)
WBC: 9.9 10*3/uL (ref 4.0–10.5)

## 2013-03-14 LAB — I-STAT CHEM 8, ED
BUN: 15 mg/dL (ref 6–23)
CHLORIDE: 101 meq/L (ref 96–112)
Calcium, Ion: 1.2 mmol/L (ref 1.12–1.23)
Creatinine, Ser: 1.1 mg/dL (ref 0.50–1.35)
Glucose, Bld: 97 mg/dL (ref 70–99)
HCT: 46 % (ref 39.0–52.0)
Hemoglobin: 15.6 g/dL (ref 13.0–17.0)
Potassium: 3.8 mEq/L (ref 3.7–5.3)
SODIUM: 141 meq/L (ref 137–147)
TCO2: 27 mmol/L (ref 0–100)

## 2013-03-14 LAB — BASIC METABOLIC PANEL
BUN: 15 mg/dL (ref 6–23)
CHLORIDE: 99 meq/L (ref 96–112)
CO2: 25 mEq/L (ref 19–32)
Calcium: 9.3 mg/dL (ref 8.4–10.5)
Creatinine, Ser: 1.08 mg/dL (ref 0.50–1.35)
GFR calc non Af Amer: 82 mL/min — ABNORMAL LOW (ref >90)
Glucose, Bld: 115 mg/dL — ABNORMAL HIGH (ref 70–99)
POTASSIUM: 3.9 meq/L (ref 3.7–5.3)
Sodium: 137 mEq/L (ref 137–147)

## 2013-03-14 LAB — CBG MONITORING, ED
GLUCOSE-CAPILLARY: 122 mg/dL — AB (ref 70–99)
GLUCOSE-CAPILLARY: 95 mg/dL (ref 70–99)

## 2013-03-14 LAB — I-STAT TROPONIN, ED: Troponin i, poc: 0 ng/mL (ref 0.00–0.08)

## 2013-03-14 MED ORDER — HYDROCODONE-ACETAMINOPHEN 5-325 MG PO TABS: 1.0000 | ORAL_TABLET | Freq: Four times a day (QID) | ORAL | Status: DC | PRN

## 2013-03-14 NOTE — ED Notes (Signed)
Thayer Ohmhris, EMT at bedside to apply right knee immobilizer and to teach patient how to use crutches.

## 2013-03-14 NOTE — ED Notes (Signed)
[place], PA at bedside. 

## 2013-03-14 NOTE — Discharge Instructions (Signed)
Knee Effusion The medical term for having fluid in your knee is effusion. This is often due to an internal derangement of the knee. This means something is wrong inside the knee. Some of the causes of fluid in the knee may be torn cartilage, a torn ligament, or bleeding into the joint from an injury. Your knee is likely more difficult to bend and move. This is often because there is increased pain and pressure in the joint. The time it takes for recovery from a knee effusion depends on different factors, including:   Type of injury.  Your age.  Physical and medical conditions.  Rehabilitation Strategies. How long you will be away from your normal activities will depend on what kind of knee problem you have and how much damage is present. Your knee has two types of cartilage. Articular cartilage covers the bone ends and lets your knee bend and move smoothly. Two menisci, thick pads of cartilage that form a rim inside the joint, help absorb shock and stabilize your knee. Ligaments bind the bones together and support your knee joint. Muscles move the joint, help support your knee, and take stress off the joint itself. CAUSES  Often an effusion in the knee is caused by an injury to one of the menisci. This is often a tear in the cartilage. Recovery after a meniscus injury depends on how much meniscus is damaged and whether you have damaged other knee tissue. Small tears may heal on their own with conservative treatment. Conservative means rest, limited weight bearing activity and muscle strengthening exercises. Your recovery may take up to 6 weeks.  TREATMENT  Larger tears may require surgery. Meniscus injuries may be treated during arthroscopy. Arthroscopy is a procedure in which your surgeon uses a small telescope like instrument to look in your knee. Your caregiver can make a more accurate diagnosis (learning what is wrong) by performing an arthroscopic procedure. If your injury is on the inner margin  of the meniscus, your surgeon may trim the meniscus back to a smooth rim. In other cases your surgeon will try to repair a damaged meniscus with stitches (sutures). This may make rehabilitation take longer, but may provide better long term result by helping your knee keep its shock absorption capabilities. Ligaments which are completely torn usually require surgery for repair. HOME CARE INSTRUCTIONS  Use crutches as instructed.  If a brace is applied, use as directed.  Once you are home, an ice pack applied to your swollen knee may help with discomfort and help decrease swelling.  Keep your knee raised (elevated) when you are not up and around or on crutches.  Only take over-the-counter or prescription medicines for pain, discomfort, or fever as directed by your caregiver.  Your caregivers will help with instructions for rehabilitation of your knee. This often includes strengthening exercises.  You may resume a normal diet and activities as directed. SEEK MEDICAL CARE IF:   There is increased swelling in your knee.  You notice redness, swelling, or increasing pain in your knee.  An unexplained oral temperature above 102 F (38.9 C) develops. SEEK IMMEDIATE MEDICAL CARE IF:   You develop a rash.  You have difficulty breathing.  You have any allergic reactions from medications you may have been given.  There is severe pain with any motion of the knee. MAKE SURE YOU:   Understand these instructions.  Will watch your condition.  Will get help right away if you are not doing well or get worse.  Document Released: 03/31/2003 Document Revised: 04/02/2011 Document Reviewed: 06/04/2007 [facility] Patient Information 2014 [place], [place].  Knee Sprain A knee sprain is a tear in one of the strong, fibrous tissues that connect the bones (ligaments) in your knee. The severity of the sprain depends on how much of the ligament is torn. The tear can be either partial or complete. CAUSES    Often, sprains are a result of a fall or injury. The force of the impact causes the fibers of your ligament to stretch too much. This excess tension causes the fibers of your ligament to tear. SIGNS AND SYMPTOMS  You may have some loss of motion in your knee. Other symptoms include:  Bruising.  Pain in the knee area.  Tenderness of the knee to the touch.  Swelling. DIAGNOSIS  To diagnose a knee sprain, your health care provider will physically examine your knee. Your health care provider may also suggest an X-ray exam of your knee to make sure no bones are broken. TREATMENT  If your ligament is only partially torn, treatment usually involves keeping the knee in a fixed position (immobilization) or bracing your knee for activities that require movement for several weeks. To do this, your health care provider will apply a bandage, cast, or splint to keep your knee from moving and to support your knee during movement until it heals. For a partially torn ligament, the healing process usually takes 4 6 weeks. If your ligament is completely torn, depending on which ligament it is, you may need surgery to reconnect the ligament to the bone or reconstruct it. After surgery, a cast or splint may be applied and will need to stay on your knee for 4 6 weeks while your ligament heals. HOME CARE INSTRUCTIONS  Keep your injured knee elevated to decrease swelling.  To ease pain and swelling, apply ice to the injured area:  Put ice in a plastic bag.  Place a towel between your skin and the bag.  Leave the ice on for 20 minutes, 2 3 times a day.  Only take medicine for pain as directed by your health care provider.  Do not leave your knee unprotected until pain and stiffness go away (usually 4 6 weeks).  If you have a cast or splint, do not allow it to get wet. If you have been instructed not to remove it, cover it with a plastic bag when you shower or bathe. Do not swim.  Your health care provider  may suggest exercises for you to do during your recovery to prevent or limit permanent weakness and stiffness. SEEK IMMEDIATE MEDICAL CARE IF:  Your cast or splint becomes damaged.  Your pain becomes worse.  You have significant pain, swelling, or numbness below the cast or splint. MAKE SURE YOU:  Understand these instructions.  Will watch your condition.  Will get help right away if you are not doing well or get worse. Document Released: 01/08/2005 Document Revised: 10/29/2012 Document Reviewed: 08/20/2012 [facility] Patient Information 2014 [place], [place].  Near-Syncope Near-syncope (commonly known as near fainting) is sudden weakness, dizziness, or feeling like you might pass out. During an episode of near-syncope, you may also develop pale skin, have tunnel vision, or feel sick to your stomach (nauseous). Near-syncope may occur when getting up after sitting or while standing for a long time. It is caused by a sudden decrease in blood flow to the brain. This decrease can result from various causes or triggers, most of which are not serious. However,  because near-syncope can sometimes be a sign of something serious, a medical evaluation is required. The specific cause is often not determined. HOME CARE INSTRUCTIONS  Monitor your condition for any changes. The following actions may help to alleviate any discomfort you are experiencing:  Have someone stay with you until you feel stable.  Lie down right away if you start feeling like you might faint. Breathe deeply and steadily. Wait until all the symptoms have passed. Most of these episodes last only a few minutes. You may feel tired for several hours.   Drink enough fluids to keep your urine clear or pale yellow.   If you are taking blood pressure or heart medicine, get up slowly when seated or lying down. Take several minutes to sit and then stand. This can reduce dizziness.  Follow up with your health care provider as  directed. SEEK IMMEDIATE MEDICAL CARE IF:   You have a severe headache.   You have unusual pain in the chest, abdomen, or back.   You are bleeding from the mouth or rectum, or you have black or tarry stool.   You have an irregular or very fast heartbeat.   You have repeated fainting or have seizure-like jerking during an episode.   You faint when sitting or lying down.   You have confusion.   You have difficulty walking.   You have severe weakness.   You have vision problems.  MAKE SURE YOU:   Understand these instructions.  Will watch your condition.  Will get help right away if you are not doing well or get worse. Document Released: 01/08/2005 Document Revised: 09/10/2012 Document Reviewed: 06/13/2012 [facility] Patient Information 2014 [place].

## 2013-03-14 NOTE — ED Notes (Signed)
Pt was up on stand and changing lightbulb and had syncopal event and land on his knee.  Pt is borderline diabetic.  Pt is only complaining of right knee pain.

## 2013-03-14 NOTE — ED Notes (Signed)
phlebotomy at bedside.  

## 2013-03-14 NOTE — ED Notes (Signed)
Ortho paged at this time 

## 2013-03-14 NOTE — ED Provider Notes (Signed)
CSN: [id number]     Arrival date & time 03/14/13  1652 History   First MD Initiated Contact with Patient 03/14/13 1812     Chief Complaint  Patient presents with  . Loss of Consciousness  . Knee Pain     (Consider location/radiation/quality/duration/timing/severity/associated sxs/prior Treatment) HPI Comments: Patient is 43 year old male with PMHx of obesity, HTN, arthritis and 5 surgeries to his right knee who presents to the ED after standing on a table and changing a light bulb yesterday afternoon - he states that when he looked up and then down again he blacked out for a few seconds and then fell off the end of the table and landed on his right knee.  He denies nausea, vomiting, dizziness, headache, neck pain, chest pain, shortness of breath, abdominal pain, bleeding.  States that he has had previous episodes of syncope which was worked up by his PCP.  He states he is really here because of his right knee pain.  He reports has been having to use his crutches to get around on.  Reports increase in swelling and pain worse with flexion of the knee.  Denies numbness, tingling, but reports feels like his knee may give out.  Patient is a 43 y.o. male presenting with syncope and knee pain. The history is provided by the patient. No language interpreter was used.  Loss of Consciousness Episode history:  Single Most recent episode:  Yesterday Duration:  2 seconds Timing:  Rare Progression:  Resolved Chronicity:  Recurrent Context: normal activity   Context: not bowel movement, not dehydration, not exertion, not medication change, not sight of blood, not sitting down, not standing up and not urination   Witnessed: yes   Relieved by:  Nothing Worsened by:  Nothing tried Ineffective treatments:  None tried Associated symptoms: no anxiety, no chest pain, no diaphoresis, no difficulty breathing, no fever, no focal sensory loss, no focal weakness, no headaches, no nausea, no palpitations, no recent  fall, no recent injury, no rectal bleeding, no seizures, no vomiting and no weakness   Knee Pain Associated symptoms: no fever     Past Medical History  Diagnosis Date  . PUD (peptic ulcer disease)   . Sleep apnea   . Obesity   . Hypertension   . Hyperlipidemia   . Prediabetes   . Vitamin D deficiency   . Arthritis    Past Surgical History  Procedure Laterality Date  . Knee surgery     No family history on file. History  Substance Use Topics  . Smoking status: Never Smoker   . Smokeless tobacco: Not on file  . Alcohol Use: No    Review of Systems  Constitutional: Negative for fever and diaphoresis.  Cardiovascular: Positive for syncope. Negative for chest pain and palpitations.  Gastrointestinal: Negative for nausea and vomiting.  Neurological: Negative for focal weakness, seizures, weakness and headaches.  All other systems reviewed and are negative.      Allergies  Aspirin and Nsaids  Home Medications   Current Outpatient Rx  Name  Route  Sig  Dispense  Refill  . Cholecalciferol (VITAMIN D PO)   Oral   Take 5,000 Int'l Units by mouth daily.         Marland Kitchen. gabapentin (NEURONTIN) 300 MG capsule   Oral   Take 300 mg by mouth 3 (three) times daily.         Marland Kitchen. HYDROcodone-acetaminophen (NORCO/VICODIN) 5-325 MG per tablet   Oral   Take  1 tablet by mouth every 6 (six) hours as needed for moderate pain.         . simvastatin (ZOCOR) 40 MG tablet   Oral   Take 40 mg by mouth daily.          BP 100/83  Pulse 73  Temp(Src) 98.1 F (36.7 C) (Oral)  Resp 18  SpO2 98% Physical Exam  Nursing note and vitals reviewed. Constitutional: He is oriented to person, place, and time. He appears well-developed and well-nourished. No distress.  HENT:  Head: Normocephalic and atraumatic.  Right Ear: External ear normal.  Left Ear: External ear normal.  Nose: Nose normal.  Mouth/Throat: Oropharynx is clear and moist. No oropharyngeal exudate.  Eyes: Conjunctivae  are normal. Pupils are equal, round, and reactive to light. No scleral icterus.  Neck: Normal range of motion. Neck supple.  Cardiovascular: Normal rate, regular rhythm and normal heart sounds.  Exam reveals no gallop and no friction rub.   No murmur heard. Pulmonary/Chest: Effort normal and breath sounds normal. No respiratory distress. He has no wheezes. He has no rales. He exhibits no tenderness.  Abdominal: Soft. Bowel sounds are normal. He exhibits no distension. There is no tenderness. There is no rebound and no guarding.  Musculoskeletal:       Right knee: He exhibits decreased range of motion, swelling, effusion and bony tenderness. He exhibits no ecchymosis, no deformity, no erythema, normal alignment, no LCL laxity and no MCL laxity. Tenderness found. Medial joint line tenderness noted. No patellar tendon tenderness noted.       Left knee: He exhibits normal range of motion, no swelling, no effusion, no deformity, normal alignment, no bony tenderness and normal meniscus. No tenderness found.  Lymphadenopathy:    He has no cervical adenopathy.  Neurological: He is alert and oriented to person, place, and time. He exhibits normal muscle tone. Coordination normal.  Skin: Skin is warm and dry. No rash noted. No erythema. No pallor.  Psychiatric: He has a normal mood and affect. His behavior is normal. Judgment and thought content normal.    ED Course  Procedures (including critical care time) Labs Review Labs Reviewed  BASIC METABOLIC PANEL - Abnormal; Notable for the following:    Glucose, Bld 115 (*)    GFR calc non Af Amer 82 (*)    All other components within normal limits  CBG MONITORING, ED - Abnormal; Notable for the following:    Glucose-Capillary 122 (*)    All other components within normal limits  CBC  I-STAT CHEM 8, ED  I-STAT TROPOININ, ED  CBG MONITORING, ED   Imaging Review Dg Chest 1 View  03/14/2013   CLINICAL DATA:  Abnormal chest radiograph question left  nipple shadow versus nodule  EXAM: CHEST - 1 VIEW  COMPARISON:  Earlier study of 03/14/2013  FINDINGS: Left nipple marker corresponds to nodular density seen on previous exam compatible with nipple shadow.  Lungs remain clear.  No pleural effusion or pneumothorax.  Heart size stable.  Bones unremarkable.  IMPRESSION: Left nipple shadow on previous exam.  No evidence of pulmonary nodule or acute infiltrate.   Electronically Signed   By: Ulyses Southward M.D.   On: 03/14/2013 20:45   Dg Chest 2 View  03/14/2013   CLINICAL DATA:  Syncope.  Fall.  Hypertension.  EXAM: CHEST  2 VIEW  COMPARISON:  None.  FINDINGS: The heart size and mediastinal contours are within normal limits. No evidence of pulmonary infiltrate or edema.  No evidence of pleural effusion.  Nodular density is seen overlying the left lower lung on the frontal projection, which may represent a nipple shadow although pulmonary nodule cannot be excluded.  IMPRESSION: No acute findings.  Question left lower lung nodule versus nipple shadow. Recommend repeat PA chest radiograph with nipple markers.   Electronically Signed   By: Myles Rosenthal M.D.   On: 03/14/2013 20:02   Dg Knee Complete 4 Views Right  03/14/2013   CLINICAL DATA:  Fall.  Right knee injury and pain.  EXAM: RIGHT KNEE - COMPLETE 4+ VIEW  COMPARISON:  None.  FINDINGS: No evidence of acute fracture or dislocation. Moderate knee joint effusion is noted.  A surgical screw seen in the anterior tibial tubercle. Mild degenerative spurring is seen involving the patella. No other signs of arthropathy. No evidence of joint space narrowing or other bone lesions.  IMPRESSION: Moderate knee joint effusion.  No acute osseous abnormality.  Mild degenerative spurring.   Electronically Signed   By: Myles Rosenthal M.D.   On: 03/14/2013 20:04    EKG Interpretation   None       MDM   Possible syncope Right knee effusion  Patient here with possible syncopal episode yesterday, he has not had any additional  concerning symptoms of palpitations, chest pain, shortness of breath, reports that he has had a work up by his PCP for this same thing, mainly here with the right knee pain.  Plan to place in knee immobilizer and he will follow up with Dr. Eulah Pont with ortho.   Izola Price Marisue Humble, PA-C 03/14/13 2128

## 2013-03-14 NOTE — ED Notes (Signed)
Pt reports he was standing up looking at a light bulb to change it when he looked down he got lightheaded and passed out. Pt c/o pain to neck and right knee. Reports this happened yesterday. Has been able to ambulated. Pt sts he has had sx on his right knee multiple times. Pt also reports hx of neck problems, but can't remember exactly what. Pt is a&ox4. Nad, skin warm and dry, resp e/u.

## 2013-03-14 NOTE — ED Notes (Signed)
Ortho returned page at this time.

## 2013-03-15 NOTE — ED Provider Notes (Signed)
Medical screening examination/treatment/procedure(s) were performed by non-physician practitioner and as supervising physician I was immediately available for consultation/collaboration.   Hurman HornJohn M Margia Wiesen, MD 03/15/13 [phone]

## 2013-05-12 ENCOUNTER — Ambulatory Visit: Payer: Self-pay | Admitting: Physician Assistant

## 2013-06-03 ENCOUNTER — Other Ambulatory Visit: Payer: Self-pay | Admitting: Physician Assistant

## 2013-07-04 ENCOUNTER — Emergency Department (HOSPITAL_COMMUNITY)
Admission: EM | Admit: 2013-07-04 | Discharge: 2013-07-05 | Disposition: A | Discharge status: Home / Self Care | Payer: BC Managed Care – PPO | Attending: Emergency Medicine | Admitting: Emergency Medicine

## 2013-07-04 ENCOUNTER — Encounter (HOSPITAL_COMMUNITY): Payer: Self-pay | Admitting: Emergency Medicine

## 2013-07-04 DIAGNOSIS — L509 Urticaria, unspecified: Secondary | ICD-10-CM

## 2013-07-04 DIAGNOSIS — L272 Dermatitis due to ingested food: Secondary | ICD-10-CM | POA: Insufficient documentation

## 2013-07-04 DIAGNOSIS — M129 Arthropathy, unspecified: Secondary | ICD-10-CM | POA: Insufficient documentation

## 2013-07-04 DIAGNOSIS — T7840XA Allergy, unspecified, initial encounter: Secondary | ICD-10-CM

## 2013-07-04 DIAGNOSIS — Z79899 Other long term (current) drug therapy: Secondary | ICD-10-CM | POA: Insufficient documentation

## 2013-07-04 DIAGNOSIS — I10 Essential (primary) hypertension: Secondary | ICD-10-CM | POA: Insufficient documentation

## 2013-07-04 DIAGNOSIS — E669 Obesity, unspecified: Secondary | ICD-10-CM | POA: Insufficient documentation

## 2013-07-04 DIAGNOSIS — Z8719 Personal history of other diseases of the digestive system: Secondary | ICD-10-CM | POA: Insufficient documentation

## 2013-07-04 DIAGNOSIS — M7989 Other specified soft tissue disorders: Secondary | ICD-10-CM | POA: Insufficient documentation

## 2013-07-04 DIAGNOSIS — E509 Vitamin A deficiency, unspecified: Secondary | ICD-10-CM | POA: Insufficient documentation

## 2013-07-04 NOTE — ED Notes (Signed)
Pt arrives with c/o rash and swelling that started Tuesday after cutting chicken with non-latex gloves on. States he also had some bamboo shoots that he thinks might have caused it. Rash/welts/itching all over entire body, swelling in hands, no airway compromise.

## 2013-07-04 NOTE — ED Notes (Signed)
PTA benadryl at 2300, has been taking since Tuesday with no relief

## 2013-07-05 MED ORDER — DIPHENHYDRAMINE HCL 12.5 MG/5ML PO ELIX
25.0000 mg | ORAL_SOLUTION | Freq: Once | ORAL | Status: DC
  Filled 2013-07-05: qty 10

## 2013-07-05 MED ORDER — METHYLPREDNISOLONE SODIUM SUCC 125 MG IJ SOLR
125.0000 mg | Freq: Once | INTRAMUSCULAR | Status: AC
  Administered 2013-07-05: 125 mg via INTRAVENOUS
  Filled 2013-07-05: qty 2

## 2013-07-05 MED ORDER — DIPHENHYDRAMINE HCL 50 MG/ML IJ SOLN
25.0000 mg | Freq: Once | INTRAMUSCULAR | Status: AC
  Administered 2013-07-05: 25 mg via INTRAVENOUS

## 2013-07-05 MED ORDER — PREDNISONE 20 MG PO TABS: 60.0000 mg | ORAL_TABLET | Freq: Every day | ORAL | Status: DC

## 2013-07-05 MED ORDER — EPINEPHRINE 0.3 MG/0.3ML IJ SOAJ: 0.3000 mg | Freq: Once | INTRAMUSCULAR | Status: DC

## 2013-07-05 MED ORDER — FAMOTIDINE IN NACL 20-0.9 MG/50ML-% IV SOLN
20.0000 mg | Freq: Once | INTRAVENOUS | Status: AC
  Administered 2013-07-05: 20 mg via INTRAVENOUS
  Filled 2013-07-05: qty 50

## 2013-07-05 MED ORDER — FAMOTIDINE 20 MG PO TABS: 20.0000 mg | ORAL_TABLET | Freq: Two times a day (BID) | ORAL | Status: DC

## 2013-07-05 MED ORDER — DIPHENHYDRAMINE HCL 25 MG PO TABS: 25.0000 mg | ORAL_TABLET | Freq: Four times a day (QID) | ORAL | Status: AC

## 2013-07-05 NOTE — ED Provider Notes (Signed)
CSN: [id number]     Arrival date & time 07/04/13  2318 History   First MD Initiated Contact with Patient 07/05/13 0029     Chief Complaint  Patient presents with  . Allergic Reaction     (Consider location/radiation/quality/duration/timing/severity/associated sxs/prior Treatment) HPI 43 yo male presents to emergency room from home with complaint of hives.  Patient is has been having hives intermittently since Tuesday.  Patient reports he had mild bamboo shoots on Tuesday, along with cutting a chicken with nonlatex gloves.  He has had swelling of his hands and diffuse hives over his body.  Symptoms had improved somewhat, until today when he had a small cup of bamboo soup.  Soon afterwards he had worsening of his symptoms.  He has been taking Benadryl with only minimal relief.  He denies any shortness of breath wheezing swelling of lips tongue or difficulty swallowing.  No prior history of same. Past Medical History  Diagnosis Date  . PUD (peptic ulcer disease)   . Sleep apnea   . Obesity   . Hypertension   . Hyperlipidemia   . Prediabetes   . Vitamin D deficiency   . Arthritis    Past Surgical History  Procedure Laterality Date  . Knee surgery     No family history on file. History  Substance Use Topics  . Smoking status: Never Smoker   . Smokeless tobacco: Not on file  . Alcohol Use: No    Review of Systems   See History of Present Illness; otherwise all other systems are reviewed and negative  Allergies  Aspirin and Nsaids  Home Medications   Prior to Admission medications   Medication Sig Start Date End Date Taking? Authorizing Provider  Cholecalciferol (VITAMIN D PO) Take 5,000 Units by mouth daily.    Yes Historical Provider, MD   BP 140/93  Pulse 91  Temp(Src) 98.5 F (36.9 C) (Oral)  Resp 25  Ht 5\' 10"  (1.778 m)  Wt 289 lb (131.09 kg)  BMI 41.47 kg/m2  SpO2 90% Physical Exam  Nursing note and vitals reviewed. Constitutional: He is oriented to person,  place, and time. He appears well-developed and well-nourished.  HENT:  Head: Normocephalic and atraumatic.  Right Ear: External ear normal.  Left Ear: External ear normal.  Nose: Nose normal.  Mouth/Throat: Oropharynx is clear and moist.  Eyes: Conjunctivae and EOM are normal. Pupils are equal, round, and reactive to light.  Neck: Normal range of motion. Neck supple. No JVD present. No tracheal deviation present. No thyromegaly present.  Cardiovascular: Normal rate, regular rhythm, normal heart sounds and intact distal pulses.  Exam reveals no gallop and no friction rub.   No murmur heard. Pulmonary/Chest: Effort normal and breath sounds normal. No stridor. No respiratory distress. He has no wheezes. He has no rales. He exhibits no tenderness.  Abdominal: Soft. Bowel sounds are normal. He exhibits no distension and no mass. There is no tenderness. There is no rebound and no guarding.  Musculoskeletal: Normal range of motion. He exhibits no edema and no tenderness.  Lymphadenopathy:    He has no cervical adenopathy.  Neurological: He is alert and oriented to person, place, and time. He exhibits normal muscle tone. Coordination normal.  Skin: Skin is warm and dry. Rash (diffuse urticaria, erythema of bilateral hands with soft tissue swelling) noted. No erythema. No pallor.  Psychiatric: He has a normal mood and affect. His behavior is normal. Judgment and thought content normal.    ED Course  Procedures (including critical care time) Labs Review Labs Reviewed - No data to display  Imaging Review No results found.   EKG Interpretation None      MDM   Final diagnoses:  Hives  Allergic reaction    43 year old male with allergic reaction, most likely due to 2 recent ingestion of bamboo shoots.  No signs of anaphylaxis.  Hives are improving with Benadryl Solu-Medrol Pepcid.  He reports itching is better.  Plan to discharge home to continue treatment and will refer to  allergist.    Olivia Mackielga M Devann Cribb, MD 07/05/13 0200

## 2013-07-05 NOTE — Discharge Instructions (Signed)
Allergies °Allergies may happen from anything your body is sensitive to. This may be food, medicines, pollens, chemicals, and nearly anything around you in everyday life that produces allergens. An allergen is anything that causes an allergy producing substance. Heredity is often a factor in causing these problems. This means you may have some of the same allergies as your parents. °Food allergies happen in all age groups. Food allergies are some of the most severe and life threatening. Some common food allergies are cow's milk, seafood, eggs, nuts, wheat, and soybeans. °SYMPTOMS  °· Swelling around the mouth. °· An itchy red rash or hives. °· Vomiting or diarrhea. °· Difficulty breathing. °SEVERE ALLERGIC REACTIONS ARE LIFE-THREATENING. °This reaction is called anaphylaxis. It can cause the mouth and throat to swell and cause difficulty with breathing and swallowing. In severe reactions only a trace amount of food (for example, peanut oil in a salad) may cause death within seconds. °Seasonal allergies occur in all age groups. These are seasonal because they usually occur during the same season every year. They may be a reaction to molds, grass pollens, or tree pollens. Other causes of problems are house dust mite allergens, pet dander, and mold spores. The symptoms often consist of nasal congestion, a runny itchy nose associated with sneezing, and tearing itchy eyes. There is often an associated itching of the mouth and ears. The problems happen when you come in contact with pollens and other allergens. Allergens are the particles in the air that the body reacts to with an allergic reaction. This causes you to release allergic antibodies. Through a chain of events, these eventually cause you to release histamine into the blood stream. Although it is meant to be protective to the body, it is this release that causes your discomfort. This is why you were given anti-histamines to feel better.  If you are unable to  pinpoint the offending allergen, it may be determined by skin or blood testing. Allergies cannot be cured but can be controlled with medicine. °Hay fever is a collection of all or some of the seasonal allergy problems. It may often be treated with simple over-the-counter medicine such as diphenhydramine. Take medicine as directed. Do not drink alcohol or drive while taking this medicine. Check with your caregiver or package insert for child dosages. °If these medicines are not effective, there are many new medicines your caregiver can prescribe. Stronger medicine such as nasal spray, eye drops, and corticosteroids may be used if the first things you try do not work well. Other treatments such as immunotherapy or desensitizing injections can be used if all else fails. Follow up with your caregiver if problems continue. These seasonal allergies are usually not life threatening. They are generally more of a nuisance that can often be handled using medicine. °HOME CARE INSTRUCTIONS  °· If unsure what causes a reaction, keep a diary of foods eaten and symptoms that follow. Avoid foods that cause reactions. °· If hives or rash are present: °· Take medicine as directed. °· You may use an over-the-counter antihistamine (diphenhydramine) for hives and itching as needed. °· Apply cold compresses (cloths) to the skin or take baths in cool water. Avoid hot baths or showers. Heat will make a rash and itching worse. °· If you are severely allergic: °· Following a treatment for a severe reaction, hospitalization is often required for closer follow-up. °· Wear a medic-alert bracelet or necklace stating the allergy. °· You and your family must learn how to give adrenaline or use   an anaphylaxis kit.  If you have had a severe reaction, always carry your anaphylaxis kit or EpiPen with you. Use this medicine as directed by your caregiver if a severe reaction is occurring. Failure to do so could have a fatal outcome. SEEK MEDICAL  CARE IF:  You suspect a food allergy. Symptoms generally happen within 30 minutes of eating a food.  Your symptoms have not gone away within 2 days or are getting worse.  You develop new symptoms.  You want to retest yourself or your child with a food or drink you think causes an allergic reaction. Never do this if an anaphylactic reaction to that food or drink has happened before. Only do this under the care of a caregiver. SEEK IMMEDIATE MEDICAL CARE IF:   You have difficulty breathing, are wheezing, or have a tight feeling in your chest or throat.  You have a swollen mouth, or you have hives, swelling, or itching all over your body.  You have had a severe reaction that has responded to your anaphylaxis kit or an EpiPen. These reactions may return when the medicine has worn off. These reactions should be considered life threatening. MAKE SURE YOU:   Understand these instructions.  Will watch your condition.  Will get help right away if you are not doing well or get worse. Document Released: 04/03/2002 Document Revised: 05/05/2012 Document Reviewed: 09/08/2007 [facility] Patient Information 2014 [place].  Food Allergy A food allergy causes your body to have a strange reaction after you eat or drink certain foods or drinks. Allergic reactions can cause puffiness (swelling) and itchy, red rashes and hives. Sometimes you will throw up (vomit) or have watery poop (diarrhea). Severe allergic reactions can be life-threatening. These reactions can make it hard to breathe or swallow. HOME CARE If you do not know what caused your allergic reaction:  Write down the foods and drinks you had before the reaction.  Write down any problems you had.  Stop eating or drinking things that cause you to have a reaction. If you have hives or a rash:  Take medicine as told by your doctor.  Place cold cloths on your skin.  Take baths in cool water.  Do not take hot baths or showers. If  you are severely allergic:  Wear a medical bracelet or necklace that lists your allergy.  Carry your allergy kit or medicine shot to treat severe allergic reactions with you. These can save your life.  Carry backup medicine shots. You can have a delayed reaction after the medicine from your first shot wears off. This can be just as serious as the first reaction.  Do not drive until medicine from your shot has worn off, unless your doctor says it is okay. GET HELP RIGHT AWAY IF:   You have trouble breathing or you are wheezing.  You have a tight feeling in your chest or throat.  You have puffiness around your mouth.  You have hives, puffiness, or itching all over your body.  You think you are having an allergic reaction. Problems usually start within 30 minutes after eating a food you are allergic to.  Your problems are not better after 2 days.  You have new problems.  Your problems come back. MAKE SURE YOU:   Understand these instructions.  Will watch your condition.  Will get help right away if you are not doing well or get worse. Document Released: 06/28/2009 Document Revised: 04/02/2011 Document Reviewed: 06/28/2009 [employer] Patient Information 2014 [employer]  Hives Hives are itchy, red, swollen areas of the skin. They can vary in size and location on your body. Hives can come and go for hours or several days (acute hives) or for several weeks (chronic hives). Hives do not spread from person to person (noncontagious). They may get worse with scratching, exercise, and emotional stress. CAUSES   Allergic reaction to food, additives, or drugs.  Infections, including the common cold.  Illness, such as vasculitis, lupus, or thyroid disease.  Exposure to sunlight, heat, or cold.  Exercise.  Stress.  Contact with chemicals. SYMPTOMS   Red or white swollen patches on the skin. The patches may change size, shape, and location quickly and  repeatedly.  Itching.  Swelling of the hands, feet, and face. This may occur if hives develop deeper in the skin. DIAGNOSIS  Your caregiver can usually tell what is wrong by performing a physical exam. Skin or blood tests may also be done to determine the cause of your hives. In some cases, the cause cannot be determined. TREATMENT  Mild cases usually get better with medicines such as antihistamines. Severe cases may require an emergency epinephrine injection. If the cause of your hives is known, treatment includes avoiding that trigger.  HOME CARE INSTRUCTIONS   Avoid causes that trigger your hives.  Take antihistamines as directed by your caregiver to reduce the severity of your hives. Non-sedating or low-sedating antihistamines are usually recommended. Do not drive while taking an antihistamine.  Take any other medicines prescribed for itching as directed by your caregiver.  Wear loose-fitting clothing.  Keep all follow-up appointments as directed by your caregiver. SEEK MEDICAL CARE IF:   You have persistent or severe itching that is not relieved with medicine.  You have painful or swollen joints. SEEK IMMEDIATE MEDICAL CARE IF:   You have a fever.  Your tongue or lips are swollen.  You have trouble breathing or swallowing.  You feel tightness in the throat or chest.  You have abdominal pain. These problems may be the first sign of a life-threatening allergic reaction. Call your local emergency services (911 in [place] MAKE SURE YOU:   Understand these instructions.  Will watch your condition.  Will get help right away if you are not doing well or get worse. Document Released: 01/08/2005 Document Revised: 07/10/2011 Document Reviewed: 04/03/2011 [facility] Patient Information 2014 [place].

## 2013-07-05 NOTE — ED Notes (Signed)
Pt took 25mg  of Benadryl at 23:00 this evening. Pt has been taking benadryl every 4 hours for the past 4 days since his first rash eating bamboo soup. Pt had bamboo soup Tuesday and then again this evening both causing rash and hives. Pt denies SOB or throat swelling. Pt has hives to eyes, faces, arms and chest.

## 2013-07-20 ENCOUNTER — Other Ambulatory Visit: Payer: Self-pay | Admitting: Physician Assistant

## 2013-07-30 ENCOUNTER — Ambulatory Visit (HOSPITAL_BASED_OUTPATIENT_CLINIC_OR_DEPARTMENT_OTHER)
Admission: RE | Admit: 2013-07-30 | Payer: BC Managed Care – PPO | Source: Ambulatory Visit | Admitting: Orthopedic Surgery

## 2013-07-30 ENCOUNTER — Encounter (HOSPITAL_BASED_OUTPATIENT_CLINIC_OR_DEPARTMENT_OTHER): Admission: RE | Payer: Self-pay | Source: Ambulatory Visit

## 2013-07-30 SURGERY — KNEE ARTHROSCOPY WITH ANTERIOR CRUCIATE LIGAMENT (ACL) REPAIR
Anesthesia: General | Laterality: Right

## 2014-02-01 ENCOUNTER — Other Ambulatory Visit: Payer: Self-pay | Admitting: Rehabilitation

## 2014-02-01 DIAGNOSIS — M542 Cervicalgia: Secondary | ICD-10-CM

## 2014-02-15 ENCOUNTER — Other Ambulatory Visit: Payer: Self-pay

## 2014-03-12 ENCOUNTER — Ambulatory Visit
Admission: RE | Admit: 2014-03-12 | Discharge: 2014-03-12 | Disposition: A | Discharge status: Home / Self Care | Payer: BLUE CROSS/BLUE SHIELD | Source: Ambulatory Visit | Attending: Rehabilitation | Admitting: Rehabilitation

## 2014-03-12 DIAGNOSIS — M542 Cervicalgia: Secondary | ICD-10-CM

## 2014-11-30 ENCOUNTER — Other Ambulatory Visit: Payer: Self-pay | Admitting: Orthopedic Surgery

## 2014-11-30 DIAGNOSIS — M47897 Other spondylosis, lumbosacral region: Secondary | ICD-10-CM

## 2014-12-11 ENCOUNTER — Ambulatory Visit
Admission: RE | Admit: 2014-12-11 | Discharge: 2014-12-11 | Disposition: A | Discharge status: Home / Self Care | Payer: BLUE CROSS/BLUE SHIELD | Source: Ambulatory Visit | Attending: Orthopedic Surgery | Admitting: Orthopedic Surgery

## 2014-12-11 DIAGNOSIS — M47897 Other spondylosis, lumbosacral region: Secondary | ICD-10-CM

## 2015-05-27 DIAGNOSIS — G4733 Obstructive sleep apnea (adult) (pediatric): Secondary | ICD-10-CM | POA: Diagnosis not present

## 2015-05-27 DIAGNOSIS — Z131 Encounter for screening for diabetes mellitus: Secondary | ICD-10-CM | POA: Diagnosis not present

## 2015-05-27 DIAGNOSIS — Z713 Dietary counseling and surveillance: Secondary | ICD-10-CM | POA: Diagnosis not present

## 2015-05-27 DIAGNOSIS — R05 Cough: Secondary | ICD-10-CM | POA: Diagnosis not present

## 2015-05-27 DIAGNOSIS — J4540 Moderate persistent asthma, uncomplicated: Secondary | ICD-10-CM | POA: Diagnosis not present

## 2015-06-01 DIAGNOSIS — G4733 Obstructive sleep apnea (adult) (pediatric): Secondary | ICD-10-CM | POA: Diagnosis not present

## 2015-06-01 DIAGNOSIS — J309 Allergic rhinitis, unspecified: Secondary | ICD-10-CM | POA: Diagnosis not present

## 2015-06-10 DIAGNOSIS — J4540 Moderate persistent asthma, uncomplicated: Secondary | ICD-10-CM | POA: Diagnosis not present

## 2015-06-10 DIAGNOSIS — Z131 Encounter for screening for diabetes mellitus: Secondary | ICD-10-CM | POA: Diagnosis not present

## 2015-06-10 DIAGNOSIS — R0781 Pleurodynia: Secondary | ICD-10-CM | POA: Diagnosis not present

## 2015-06-10 DIAGNOSIS — G4733 Obstructive sleep apnea (adult) (pediatric): Secondary | ICD-10-CM | POA: Diagnosis not present

## 2015-06-22 DIAGNOSIS — G4730 Sleep apnea, unspecified: Secondary | ICD-10-CM | POA: Diagnosis not present

## 2015-06-22 DIAGNOSIS — G4733 Obstructive sleep apnea (adult) (pediatric): Secondary | ICD-10-CM | POA: Diagnosis not present

## 2015-07-05 DIAGNOSIS — I10 Essential (primary) hypertension: Secondary | ICD-10-CM | POA: Diagnosis not present

## 2015-07-05 DIAGNOSIS — G4733 Obstructive sleep apnea (adult) (pediatric): Secondary | ICD-10-CM | POA: Diagnosis not present

## 2015-07-05 DIAGNOSIS — J4540 Moderate persistent asthma, uncomplicated: Secondary | ICD-10-CM | POA: Diagnosis not present

## 2015-07-11 DIAGNOSIS — G4733 Obstructive sleep apnea (adult) (pediatric): Secondary | ICD-10-CM | POA: Diagnosis not present

## 2015-08-04 DIAGNOSIS — I10 Essential (primary) hypertension: Secondary | ICD-10-CM | POA: Diagnosis not present

## 2015-08-04 DIAGNOSIS — J4540 Moderate persistent asthma, uncomplicated: Secondary | ICD-10-CM | POA: Diagnosis not present

## 2015-08-04 DIAGNOSIS — E119 Type 2 diabetes mellitus without complications: Secondary | ICD-10-CM | POA: Diagnosis not present

## 2015-08-04 DIAGNOSIS — G4733 Obstructive sleep apnea (adult) (pediatric): Secondary | ICD-10-CM | POA: Diagnosis not present

## 2015-08-05 DIAGNOSIS — E119 Type 2 diabetes mellitus without complications: Secondary | ICD-10-CM | POA: Diagnosis not present

## 2015-09-21 DIAGNOSIS — G4733 Obstructive sleep apnea (adult) (pediatric): Secondary | ICD-10-CM | POA: Diagnosis not present

## 2015-11-15 DIAGNOSIS — E785 Hyperlipidemia, unspecified: Secondary | ICD-10-CM | POA: Diagnosis not present

## 2015-11-15 DIAGNOSIS — I10 Essential (primary) hypertension: Secondary | ICD-10-CM | POA: Diagnosis not present

## 2015-11-15 DIAGNOSIS — E119 Type 2 diabetes mellitus without complications: Secondary | ICD-10-CM | POA: Diagnosis not present

## 2015-11-15 DIAGNOSIS — Z23 Encounter for immunization: Secondary | ICD-10-CM | POA: Diagnosis not present

## 2015-11-15 DIAGNOSIS — G4733 Obstructive sleep apnea (adult) (pediatric): Secondary | ICD-10-CM | POA: Diagnosis not present

## 2016-01-23 HISTORY — PX: BACK SURGERY: SHX140

## 2016-01-23 HISTORY — PX: NECK SURGERY: SHX720

## 2016-03-20 DIAGNOSIS — I10 Essential (primary) hypertension: Secondary | ICD-10-CM | POA: Diagnosis not present

## 2016-03-20 DIAGNOSIS — G4733 Obstructive sleep apnea (adult) (pediatric): Secondary | ICD-10-CM | POA: Diagnosis not present

## 2016-03-20 DIAGNOSIS — E785 Hyperlipidemia, unspecified: Secondary | ICD-10-CM | POA: Diagnosis not present

## 2016-03-20 DIAGNOSIS — E119 Type 2 diabetes mellitus without complications: Secondary | ICD-10-CM | POA: Diagnosis not present

## 2016-03-20 DIAGNOSIS — E1165 Type 2 diabetes mellitus with hyperglycemia: Secondary | ICD-10-CM | POA: Diagnosis not present

## 2016-07-30 DIAGNOSIS — G4733 Obstructive sleep apnea (adult) (pediatric): Secondary | ICD-10-CM | POA: Diagnosis not present

## 2016-07-30 DIAGNOSIS — E119 Type 2 diabetes mellitus without complications: Secondary | ICD-10-CM | POA: Diagnosis not present

## 2016-07-30 DIAGNOSIS — I10 Essential (primary) hypertension: Secondary | ICD-10-CM | POA: Diagnosis not present

## 2016-07-30 DIAGNOSIS — J4540 Moderate persistent asthma, uncomplicated: Secondary | ICD-10-CM | POA: Diagnosis not present

## 2016-07-30 DIAGNOSIS — E785 Hyperlipidemia, unspecified: Secondary | ICD-10-CM | POA: Diagnosis not present

## 2018-07-26 ENCOUNTER — Emergency Department (HOSPITAL_COMMUNITY)
Admission: EM | Admit: 2018-07-26 | Discharge: 2018-07-26 | Disposition: A | Discharge status: Home / Self Care | Payer: BC Managed Care – PPO | Attending: Emergency Medicine | Admitting: Emergency Medicine

## 2018-07-26 ENCOUNTER — Emergency Department (HOSPITAL_COMMUNITY): Payer: BC Managed Care – PPO

## 2018-07-26 ENCOUNTER — Encounter (HOSPITAL_COMMUNITY): Payer: Self-pay

## 2018-07-26 ENCOUNTER — Other Ambulatory Visit: Payer: Self-pay

## 2018-07-26 DIAGNOSIS — J181 Lobar pneumonia, unspecified organism: Secondary | ICD-10-CM | POA: Insufficient documentation

## 2018-07-26 DIAGNOSIS — U071 COVID-19: Secondary | ICD-10-CM | POA: Insufficient documentation

## 2018-07-26 DIAGNOSIS — I10 Essential (primary) hypertension: Secondary | ICD-10-CM | POA: Insufficient documentation

## 2018-07-26 DIAGNOSIS — J189 Pneumonia, unspecified organism: Secondary | ICD-10-CM

## 2018-07-26 DIAGNOSIS — Z79899 Other long term (current) drug therapy: Secondary | ICD-10-CM | POA: Insufficient documentation

## 2018-07-26 HISTORY — DX: Unspecified asthma, uncomplicated: J45.909

## 2018-07-26 MED ORDER — AZITHROMYCIN 250 MG PO TABS
500.0000 mg | ORAL_TABLET | Freq: Once | ORAL | Status: AC
  Administered 2018-07-26: 500 mg via ORAL
  Filled 2018-07-26: qty 2

## 2018-07-26 MED ORDER — AZITHROMYCIN 250 MG PO TABS: 250.0000 mg | ORAL_TABLET | Freq: Every day | ORAL | 0 refills | Status: DC

## 2018-07-26 MED ORDER — ACETAMINOPHEN 500 MG PO TABS
1000.0000 mg | ORAL_TABLET | Freq: Once | ORAL | Status: AC
  Administered 2018-07-26: 1000 mg via ORAL
  Filled 2018-07-26: qty 2

## 2018-07-26 MED ORDER — AMOXICILLIN-POT CLAVULANATE 875-125 MG PO TABS: 1.0000 | ORAL_TABLET | Freq: Two times a day (BID) | ORAL | 0 refills | Status: DC

## 2018-07-26 MED ORDER — AEROCHAMBER PLUS FLO-VU MISC
1.0000 | Freq: Once | Status: AC
  Administered 2018-07-26: 1
  Filled 2018-07-26: qty 1

## 2018-07-26 MED ORDER — AMOXICILLIN-POT CLAVULANATE 875-125 MG PO TABS
1.0000 | ORAL_TABLET | Freq: Once | ORAL | Status: AC
  Administered 2018-07-26: 1 via ORAL
  Filled 2018-07-26: qty 1

## 2018-07-26 MED ORDER — ALBUTEROL SULFATE HFA 108 (90 BASE) MCG/ACT IN AERS
2.0000 | INHALATION_SPRAY | Freq: Once | RESPIRATORY_TRACT | Status: AC
  Administered 2018-07-26: 2 via RESPIRATORY_TRACT
  Filled 2018-07-26: qty 6.7

## 2018-07-26 NOTE — ED Notes (Signed)
Bed: WA18 Expected date:  Expected time:  Means of arrival:  Comments: Neg Pressure  

## 2018-07-26 NOTE — Discharge Instructions (Signed)
Your chest x-ray shows a Donielle which is likely the cause of your symptoms, please take Augmentin and as a throw as directed, you can use Tylenol as needed for fevers and albuterol as needed for shortness of breath and cough.  You have a coronavirus test pending and will be called in about 2 days with the results.  Your chest x-ray looks more consistent with a bacterial pneumonia than the pneumonia we often see with coronavirus but I would still like for you to quarantine at home until your test results return.  If you develop fevers, worsening cough or shortness of breath, chest pain or any other new or concerning symptoms please return to the emergency department.

## 2018-07-26 NOTE — ED Notes (Signed)
X-ray at bedside

## 2018-07-26 NOTE — ED Provider Notes (Signed)
[facility]-EMERGENCY DEPT Provider Note   CSN: [id number] Arrival date & time: 07/26/18  [id number]    History   Chief Complaint Chief Complaint  Patient presents with  . Nasal Congestion  . Cough    HPI Stephen Webster is a 48 y.o. male.     Stephen Webster is a 48 y.o. male with a history of asthma, HTN, HLD, prediabetes, obesity, PUD and sleep apnea, who presents for evaluation of nasal congestion and cough. Patient reports 3 days of nasal congestion and cough, cough is productive of clear-white sputum. Patient reports chills, has not had a thermometer to take temp at home. No associated shortness of breath or chest pain. Patient does have history of asthma, but has not had to use inhaler in over two years and now it is expired. He had been taking some over the counter cold medication with some improvement. 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 He denies N/V, diarrhea or abdominal pain. Has had some sweats and muscle aches, improved after he increased his fluid intake.     Past Medical History:  Diagnosis Date  . Arthritis   . Asthma   . Hyperlipidemia   . Hypertension   . Obesity   . Prediabetes   . PUD (peptic ulcer disease)   . Sleep apnea   . Vitamin D deficiency     Patient Active Problem List   Diagnosis Date Noted  . Hypertension   . Hyperlipidemia   . Prediabetes   . Vitamin D deficiency   . Arthritis   . GASTRITIS, ACUTE 12/07/2008  . HEADACHE 12/01/2008  . ALLERGIC RHINITIS 05/15/2007  . ASTHMA 05/15/2007  . SLEEP APNEA 05/15/2007  . SYNCOPE, HX OF 05/15/2007  . GASTRIC ULCER, HX OF 05/15/2007  . CHICKENPOX, HX OF 05/15/2007    Past Surgical History:  Procedure Laterality Date  . KNEE SURGERY          Home  Medications    Prior to Admission medications   Medication Sig Start Date End Date Taking? Authorizing Provider  amoxicillin-clavulanate (AUGMENTIN) 875-125 MG tablet Take 1 tablet by mouth 2 (two) times daily. One po bid x 7 days 07/26/18   Dartha LodgeFord, Ashiyah Pavlak N, PA-C  azithromycin (ZITHROMAX) 250 MG tablet Take 1 tablet (250 mg total) by mouth daily. Take  1 every day until finished. 07/26/18   Dartha LodgeFord, Baruc Tugwell N, PA-C  Cholecalciferol (VITAMIN D PO) Take 5,000 Units by mouth daily.     [provider]  diphenhydrAMINE (BENADRYL) 25 MG tablet Take 1 tablet (25 mg total) by mouth every 6 (six) hours. 07/05/13   Marisa Severintter, Olga, MD  EPINEPHrine (EPIPEN) 0.3 mg/0.3 mL IJ SOAJ injection Inject 0.3 mLs (0.3 mg total) into the muscle once. 07/05/13   Marisa Severintter, Olga, MD  famotidine (PEPCID) 20 MG tablet Take 1 tablet (20 mg total) by mouth 2 (two) times daily. 07/05/13   Marisa Severintter, Olga, MD  predniSONE (DELTASONE) 20 MG tablet Take 3 tablets (60 mg total) by mouth daily. 07/05/13   Marisa Severintter, Olga, MD    Family History History reviewed. No pertinent family history.  Social History Social History   Tobacco Use  . Smoking status: Never Smoker  . Smokeless tobacco: Never Used  Substance Use Topics  . Alcohol use: No  .  Drug use: No     Allergies   Aspirin and Nsaids   Review of Systems Review of Systems  Constitutional: Positive for chills. Negative for fever.  HENT: Positive for congestion and rhinorrhea. Negative for ear pain and sore throat.   Eyes: Negative for visual disturbance.  Respiratory: Positive for cough. Negative for chest tightness and shortness of breath.   Cardiovascular: Negative for chest pain.  Gastrointestinal: Negative for abdominal pain, diarrhea, nausea and vomiting.  Genitourinary: Negative for dysuria.  Musculoskeletal: Positive for myalgias. Negative for arthralgias, back pain and neck pain.  Skin: Negative for color change and rash.  Neurological: Negative for dizziness, syncope  and light-headedness.     Physical Exam Updated Vital Signs BP (!) 136/102 (BP Location: Left Arm)   Pulse (!) 102   Temp 100.1 F (37.8 C) (Oral)   Resp 18   SpO2 96%   Physical Exam Vitals signs and nursing note reviewed.  Constitutional:      General: He is not in acute distress.    Appearance: Normal appearance. He is well-developed and normal weight. He is not ill-appearing or diaphoretic.  HENT:     Head: Normocephalic and atraumatic.     Nose: Congestion and rhinorrhea present.     Comments: Bilateral nares patent with moderate mucosal edema and clear rhinorrhea present.     Mouth/Throat:     Mouth: Mucous membranes are moist.     Pharynx: Posterior oropharyngeal erythema present.     Comments: Posterior oropharynx clear and mucous membranes moist, there is mild erythema but no edema or tonsillar exudates, uvula midline, normal phonation, no trismus, tolerating secretions without difficulty. Eyes:     General:        Right eye: No discharge.        Left eye: No discharge.     Conjunctiva/sclera: Conjunctivae normal.  Neck:     Musculoskeletal: Neck supple.     Comments: No rigidity Cardiovascular:     Rate and Rhythm: Normal rate and regular rhythm.     Heart sounds: Normal heart sounds. No murmur. No friction rub. No gallop.   Pulmonary:     Effort: Pulmonary effort is normal. No respiratory distress.     Breath sounds: Normal breath sounds.     Comments: Respirations equal and unlabored, patient able to speak in full sentences, lungs clear to auscultation bilaterally Abdominal:     General: Bowel sounds are normal. There is no distension.     Palpations: Abdomen is soft. There is no mass.     Tenderness: There is no abdominal tenderness. There is no guarding.     Comments: Abdomen soft, nondistended, nontender to palpation in all quadrants without guarding or peritoneal signs  Musculoskeletal:        General: No deformity.  Lymphadenopathy:     Cervical: No  cervical adenopathy.  Skin:    General: Skin is warm and dry.     Capillary Refill: Capillary refill takes less than 2 seconds.  Neurological:     Mental Status: He is alert and oriented to person, place, and time.  Psychiatric:        Mood and Affect: Mood normal.      ED Treatments / Results  Labs (all labs ordered are listed, but only abnormal results are displayed) Labs Reviewed  NOVEL CORONAVIRUS, NAA (HOSPITAL ORDER, SEND-OUT TO REF LAB)    EKG None  Radiology Dg Chest Port 1 View  Result Date: 07/26/2018 CLINICAL DATA:  Runny nose and productive cough for 3 days. EXAM: PORTABLE CHEST 1 VIEW COMPARISON:  Rib radiograph 06/10/2015 FINDINGS: Cardiac contours upper limits of normal. There is a large masslike area of consolidation within the right lower lung. No pleural effusion or pneumothorax. IMPRESSION: Large area of consolidation within the right mid and lower lung lung concerning for pneumonia in the appropriate clinical setting. Followup PA and lateral chest X-ray is recommended in 3-4 weeks following trial of antibiotic therapy to ensure resolution and exclude underlying malignancy. Electronically Signed   By: Lovey Newcomer M.D.   On: 07/26/2018 11:11    Procedures Procedures (including critical care time)  Medications Ordered in ED Medications  albuterol (VENTOLIN HFA) 108 (90 Base) MCG/ACT inhaler 2 puff (2 puffs Inhalation Given 07/26/18 1205)  aerochamber plus with mask device 1 each (1 each Other Given 07/26/18 1205)  acetaminophen (TYLENOL) tablet 1,000 mg (1,000 mg Oral Given 07/26/18 1201)  amoxicillin-clavulanate (AUGMENTIN) 875-125 MG per tablet 1 tablet (1 tablet Oral Given 07/26/18 1201)  azithromycin (ZITHROMAX) tablet 500 mg (500 mg Oral Given 07/26/18 1201)     Initial Impression / Assessment and Plan / ED Course  I have reviewed the triage vital signs and the nursing notes.  Pertinent labs & imaging results that were available during my care of the patient  were reviewed by me and considered in my medical decision making (see chart for details).  Patient presents with 3 days of cough and nasal congestion, low-grade fever on arrival but vitals otherwise stable, breathing comfortably on room air with clear lungs on exam.  Patient has had to travel some for work but has not had any known coronavirus contacts.  Will check chest x-ray and send outpatient COVID test.  Will give Tylenol and albuterol inhaler for symptom management.  X-ray shows a consolidation between the right middle and lower lung, this seems more consistent with bacterial pneumonia than with COVID pneumonia we will go ahead and treat with azithromycin and Augmentin, will have patient continue with coronavirus precautions until COVID test has returned.  He has ambulated here in the department and maintained normal O2 sat.  Feel he is stable for discharge home with continued outpatient antibiotics and close return precautions.  Discussed plan with patient and he expresses understanding and agreement.  Stephen Webster was evaluated in Emergency Department on 07/28/2018 for the symptoms described in the history of present illness. He was evaluated in the context of the global COVID-19 pandemic, which necessitated consideration that the patient might be at risk for infection with the SARS-CoV-2 virus that causes COVID-19. Institutional protocols and algorithms that pertain to the evaluation of patients at risk for COVID-19 are in a state of rapid change based on information released by regulatory bodies including the CDC and federal and state organizations. These policies and algorithms were followed during the patient's care in the ED.   Final Clinical Impressions(s) / ED Diagnoses   Final diagnoses:  Community acquired pneumonia of right middle lobe of lung [facility])    ED Discharge Orders         Ordered    amoxicillin-clavulanate (AUGMENTIN) 875-125 MG tablet  2 times daily     07/26/18 1149     azithromycin (ZITHROMAX) 250 MG tablet  Daily     07/26/18 1149           Benedetto Goad [place], [place] 07/28/18 1316    Maudie Flakes, MD 07/30/18 [phone]

## 2018-07-26 NOTE — ED Triage Notes (Addendum)
Patient arrived via POV.   Patient states he went to [facility] 6 days ago.   Patient was seen today at [facility] and was sent to ED. Patient stated [facility] physicians is not doing anything respiratory related.     C/o runny and productive cough X3 days.   Patient states he has an inhaler but has not used it in 2 years and thinks it is out.   Patient has been taking cold medication.   Denies shob, sore throat, or chest pain.  Denies n/v or diarrhea.   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   Patient states he was sweating and cramping from dehydration and has been drinking more water and Gatorade since.   A/Ox4 Ambulatory in triage.    Hx. Asthma   Per patient Allergic to Asprin

## 2018-07-29 LAB — NOVEL CORONAVIRUS, NAA (HOSP ORDER, SEND-OUT TO REF LAB; TAT 18-24 HRS): SARS-CoV-2, NAA: DETECTED — AB

## 2018-07-31 ENCOUNTER — Encounter (HOSPITAL_COMMUNITY): Payer: Self-pay

## 2018-07-31 ENCOUNTER — Other Ambulatory Visit: Payer: Self-pay

## 2018-07-31 DIAGNOSIS — U071 COVID-19: Secondary | ICD-10-CM | POA: Diagnosis not present

## 2018-07-31 DIAGNOSIS — R0602 Shortness of breath: Secondary | ICD-10-CM | POA: Diagnosis present

## 2018-07-31 DIAGNOSIS — J45909 Unspecified asthma, uncomplicated: Secondary | ICD-10-CM | POA: Insufficient documentation

## 2018-07-31 DIAGNOSIS — I10 Essential (primary) hypertension: Secondary | ICD-10-CM | POA: Insufficient documentation

## 2018-07-31 NOTE — ED Triage Notes (Signed)
Pt reports being Covid-19 positive and states he has had trouble keeping his fever down. States before he left the house it was 102.9, upon arrival temp 102.7 Last Tylenol taken at 1800

## 2018-08-01 ENCOUNTER — Emergency Department (HOSPITAL_COMMUNITY)
Admission: EM | Admit: 2018-08-01 | Discharge: 2018-08-01 | Disposition: A | Discharge status: Home / Self Care | Payer: BC Managed Care – PPO | Attending: Emergency Medicine | Admitting: Emergency Medicine

## 2018-08-01 ENCOUNTER — Encounter (HOSPITAL_COMMUNITY): Payer: Self-pay

## 2018-08-01 ENCOUNTER — Emergency Department (HOSPITAL_COMMUNITY): Payer: BC Managed Care – PPO

## 2018-08-01 DIAGNOSIS — U071 COVID-19: Secondary | ICD-10-CM

## 2018-08-01 LAB — CBC WITH DIFFERENTIAL/PLATELET
Abs Immature Granulocytes: 0.04 10*3/uL (ref 0.00–0.07)
Basophils Absolute: 0 10*3/uL (ref 0.0–0.1)
Basophils Relative: 0 %
Eosinophils Absolute: 0 10*3/uL (ref 0.0–0.5)
Eosinophils Relative: 0 %
HCT: 44.6 % (ref 39.0–52.0)
Hemoglobin: 14.8 g/dL (ref 13.0–17.0)
Immature Granulocytes: 1 %
Lymphocytes Relative: 24 %
Lymphs Abs: 1.4 10*3/uL (ref 0.7–4.0)
MCH: 29 pg (ref 26.0–34.0)
MCHC: 33.2 g/dL (ref 30.0–36.0)
MCV: 87.3 fL (ref 80.0–100.0)
Monocytes Absolute: 0.2 10*3/uL (ref 0.1–1.0)
Monocytes Relative: 4 %
Neutro Abs: 4.3 10*3/uL (ref 1.7–7.7)
Neutrophils Relative %: 71 %
Platelets: 192 10*3/uL (ref 150–400)
RBC: 5.11 MIL/uL (ref 4.22–5.81)
RDW: 13.6 % (ref 11.5–15.5)
WBC: 6 10*3/uL (ref 4.0–10.5)
nRBC: 0 % (ref 0.0–0.2)

## 2018-08-01 LAB — BASIC METABOLIC PANEL
Anion gap: 11 (ref 5–15)
BUN: 14 mg/dL (ref 6–20)
CO2: 24 mmol/L (ref 22–32)
Calcium: 8.6 mg/dL — ABNORMAL LOW (ref 8.9–10.3)
Chloride: 97 mmol/L — ABNORMAL LOW (ref 98–111)
Creatinine, Ser: 1.41 mg/dL — ABNORMAL HIGH (ref 0.61–1.24)
GFR calc Af Amer: >60 mL/min (ref >60)
GFR calc non Af Amer: 58 mL/min — ABNORMAL LOW (ref >60)
Glucose, Bld: 112 mg/dL — ABNORMAL HIGH (ref 70–99)
Potassium: 4.1 mmol/L (ref 3.5–5.1)
Sodium: 132 mmol/L — ABNORMAL LOW (ref 135–145)

## 2018-08-01 LAB — LACTIC ACID, PLASMA: Lactic Acid, Venous: 1.1 mmol/L (ref 0.5–1.9)

## 2018-08-01 MED ORDER — PREDNISONE 20 MG PO TABS
60.0000 mg | ORAL_TABLET | Freq: Once | ORAL | Status: AC
  Administered 2018-08-01: 05:00:00 60 mg via ORAL
  Filled 2018-08-01: qty 3

## 2018-08-01 MED ORDER — PREDNISONE 20 MG PO TABS: 40.0000 mg | ORAL_TABLET | Freq: Every day | ORAL | 0 refills | Status: DC

## 2018-08-01 MED ORDER — IOHEXOL 350 MG/ML SOLN
100.0000 mL | Freq: Once | INTRAVENOUS | Status: AC | PRN
  Administered 2018-08-01: 100 mL via INTRAVENOUS

## 2018-08-01 MED ORDER — HYDROCODONE-HOMATROPINE 5-1.5 MG/5ML PO SYRP: 5.0000 mL | ORAL_SOLUTION | Freq: Four times a day (QID) | ORAL | 0 refills | Status: DC | PRN

## 2018-08-01 MED ORDER — SODIUM CHLORIDE 0.9 % IV BOLUS
1000.0000 mL | Freq: Once | INTRAVENOUS | Status: AC
  Administered 2018-08-01: 01:00:00 1000 mL via INTRAVENOUS

## 2018-08-01 MED ORDER — ACETAMINOPHEN 500 MG PO TABS
1000.0000 mg | ORAL_TABLET | Freq: Once | ORAL | Status: AC
  Administered 2018-08-01: 1000 mg via ORAL
  Filled 2018-08-01: qty 2

## 2018-08-01 MED ORDER — SODIUM CHLORIDE (PF) 0.9 % IJ SOLN
INTRAMUSCULAR | Status: AC
  Filled 2018-08-01: qty 50

## 2018-08-01 NOTE — ED Provider Notes (Signed)
[facility]-EMERGENCY DEPT Provider Note   CSN: [id number] Arrival date & time: 07/31/18  2150    History   Chief Complaint Chief Complaint  Patient presents with   Fever    Covid-19 positive    HPI Stephen Webster is a 48 y.o. male.     Patient presents to the emergency department for evaluation of persistent fever with cough and worsening shortness of breath.  Patient was seen in the ER a week ago.  He had evidence of pneumonia at that time, started on Augmentin and Zithromax.  COVID-19 testing performed at that time ultimately was positive.  Patient reports fever of 102.9 at home tonight.  He has been taking Tylenol consistently but fever has not been breaking.  Cough is productive of thick milky sputum.  He has trouble breathing and worsening cough when he lies down.     Past Medical History:  Diagnosis Date   Arthritis    Asthma    Hyperlipidemia    Hypertension    Obesity    Prediabetes    PUD (peptic ulcer disease)    Sleep apnea    Vitamin D deficiency     Patient Active Problem List   Diagnosis Date Noted   Hypertension    Hyperlipidemia    Prediabetes    Vitamin D deficiency    Arthritis    GASTRITIS, ACUTE 12/07/2008   HEADACHE 12/01/2008   ALLERGIC RHINITIS 05/15/2007   ASTHMA 05/15/2007   SLEEP APNEA 05/15/2007   SYNCOPE, HX OF 05/15/2007   GASTRIC ULCER, HX OF 05/15/2007   CHICKENPOX, HX OF 05/15/2007    Past Surgical History:  Procedure Laterality Date   KNEE SURGERY          Home Medications    Prior to Admission medications   Medication Sig Start Date End Date Taking? Authorizing Provider  amoxicillin-clavulanate (AUGMENTIN) 875-125 MG tablet Take 1 tablet by mouth 2 (two) times daily. One po bid x 7 days 07/26/18   Dartha LodgeFord, Kelsey N, PA-C  azithromycin (ZITHROMAX) 250 MG tablet Take 1 tablet (250 mg total) by mouth daily. Take  1 every day until finished. 07/26/18   Dartha LodgeFord, Kelsey N, PA-C    Cholecalciferol (VITAMIN D PO) Take 5,000 Units by mouth daily.     [provider]  diphenhydrAMINE (BENADRYL) 25 MG tablet Take 1 tablet (25 mg total) by mouth every 6 (six) hours. 07/05/13   Marisa Severintter, Olga, MD  EPINEPHrine (EPIPEN) 0.3 mg/0.3 mL IJ SOAJ injection Inject 0.3 mLs (0.3 mg total) into the muscle once. 07/05/13   Marisa Severintter, Olga, MD  famotidine (PEPCID) 20 MG tablet Take 1 tablet (20 mg total) by mouth 2 (two) times daily. 07/05/13   Marisa Severintter, Olga, MD  HYDROcodone-homatropine [facility](HYCODAN) 5-1.5 MG/5ML syrup Take 5 mLs by mouth every 6 (six) hours as needed for cough. 08/01/18   Gilda CreasePollina, Jewels Langone J, MD  predniSONE (DELTASONE) 20 MG tablet Take 2 tablets (40 mg total) by mouth daily with breakfast. 08/01/18   Kasyn Rolph, Canary Brimhristopher J, MD    Family History No family history on file.  Social History Social History   Tobacco Use   Smoking status: Never Smoker   Smokeless tobacco: Never Used  Substance Use Topics   Alcohol use: No   Drug use: No     Allergies   Aspirin and Nsaids   Review of Systems Review of Systems  Constitutional: Positive for fever.  Respiratory: Positive for cough and shortness of breath.   All  other systems reviewed and are negative.    Physical Exam Updated Vital Signs BP 121/82 (BP Location: Left Arm)    Pulse 94    Temp (!) 100.4 F (38 C) (Oral)    Resp 18    SpO2 95%   Physical Exam Vitals signs and nursing note reviewed.  Constitutional:      General: He is not in acute distress.    Appearance: Normal appearance. He is well-developed.  HENT:     Head: Normocephalic and atraumatic.     Right Ear: Hearing normal.     Left Ear: Hearing normal.     Nose: Nose normal.  Eyes:     Conjunctiva/sclera: Conjunctivae normal.     Pupils: Pupils are equal, round, and reactive to light.  Neck:     Musculoskeletal: Normal range of motion and neck supple.  Cardiovascular:     Rate and Rhythm: Regular rhythm.     Heart sounds: S1 normal and  S2 normal. No murmur. No friction rub. No gallop.   Pulmonary:     Effort: Pulmonary effort is normal. No respiratory distress.     Breath sounds: Normal breath sounds.  Chest:     Chest wall: No tenderness.  Abdominal:     General: Bowel sounds are normal.     Palpations: Abdomen is soft.     Tenderness: There is no abdominal tenderness. There is no guarding or rebound. Negative signs include Murphy's sign and McBurney's sign.     Hernia: No hernia is present.  Musculoskeletal: Normal range of motion.  Skin:    General: Skin is warm and dry.     Findings: No rash.  Neurological:     Mental Status: He is alert and oriented to person, place, and time.     GCS: GCS eye subscore is 4. GCS verbal subscore is 5. GCS motor subscore is 6.     Cranial Nerves: No cranial nerve deficit.     Sensory: No sensory deficit.     Coordination: Coordination normal.  Psychiatric:        Speech: Speech normal.        Behavior: Behavior normal.        Thought Content: Thought content normal.      ED Treatments / Results  Labs (all labs ordered are listed, but only abnormal results are displayed) Labs Reviewed  BASIC METABOLIC PANEL - Abnormal; Notable for the following components:      Result Value   Sodium 132 (*)    Chloride 97 (*)    Glucose, Bld 112 (*)    Creatinine, Ser 1.41 (*)    Calcium 8.6 (*)    GFR calc non Af Amer 58 (*)    All other components within normal limits  CBC WITH DIFFERENTIAL/PLATELET  LACTIC ACID, PLASMA    EKG None  Radiology Ct Angio Chest Pe W Or Wo Contrast  Result Date: 08/01/2018 CLINICAL DATA:  PE suspected, high pretest prob. COVID-19 positive. Fever. EXAM: CT ANGIOGRAPHY CHEST WITH CONTRAST TECHNIQUE: Multidetector CT imaging of the chest was performed using the standard protocol during bolus administration of intravenous contrast. Multiplanar CT image reconstructions and MIPs were obtained to evaluate the vascular anatomy. CONTRAST:  100mL OMNIPAQUE  IOHEXOL 350 MG/ML SOLN COMPARISON:  Radiograph 07/26/2018 FINDINGS: Cardiovascular: Contrast bolus timing is suboptimal for assessment of pulmonary embolus distal to the lobar levels. No central filling defects in the pulmonary arteries. Segmental and subsegmental branches are not well assessed. Thoracic aorta  is normal in caliber. No evidence of aortic dissection. Heart is normal in size. No pericardial effusion. Mediastinum/Nodes: Prominent right lower paratracheal node at 13 mm. Prominent subcarinal node at 14 mm. Esophagus slightly patulous. Possible 16 mm right thyroid nodule. Lungs/Pleura: Bilateral multifocal ground-glass and consolidative opacities throughout all lobes of both lungs. Findings have progressed from prior radiograph. Largest area of consolidation in the perifissural right upper lobe. No septal thickening. No pleural fluid. Trachea and mainstem bronchi are patent. Upper Abdomen: Small low-density lesions in the liver, too small to accurately characterize. No acute findings. Musculoskeletal: There are no acute or suspicious osseous abnormalities. Review of the MIP images confirms the above findings. IMPRESSION: 1. Moderate to marked bilateral ground-glass and consolidative lung opacities consistent with provided history of COVID-19 pneumonia. 2. No central pulmonary embolus, evaluation of the pulmonary arteries distal to the lobar level is suboptimal due to contrast bolus timing. 3. Shotty mediastinal lymph nodes are likely reactive. 4. Suspected 16 mm right thyroid nodule. Recommend nonemergent thyroid ultrasound after resolution of acute event. Electronically Signed   By: Keith Rake M.D.   On: 08/01/2018 03:13    Procedures Procedures (including critical care time)  Medications Ordered in ED Medications  sodium chloride (PF) 0.9 % injection (has no administration in time range)  sodium chloride 0.9 % bolus 1,000 mL (1,000 mLs Intravenous New Bag/Given (Non-Interop) 08/01/18 0105)    iohexol (OMNIPAQUE) 350 MG/ML injection 100 mL (100 mLs Intravenous Contrast Given 08/01/18 0230)     Initial Impression / Assessment and Plan / ED Course  I have reviewed the triage vital signs and the nursing notes.  Pertinent labs & imaging results that were available during my care of the patient were reviewed by me and considered in my medical decision making (see chart for details).        Patient presents to the emergency department for evaluation of persistent fever and cough.  Patient seen in the ER 6 days ago and diagnosed with pneumonia by x-ray.  His COVID-19 testing performed at that visit ultimately was found to be positive.  Patient reports worsening symptoms.  Patient is febrile here in the ER.  He is not tachycardic or tachypneic.  Blood pressure is normal and his oxygen saturation is 95 to 96% on room air.  Based on the known increased risk of PE in code positive patients, he underwent CT scan to further evaluate his worsening cough and shortness of breath.  This shows persistent COVID pneumonia but no PE or other abnormality.  There was possibility of thyroid nodule which will need to be worked up as an outpatient.  As he has not hypoxic and does not appear ill, he does not require hospitalization.  Will provide analgesia and cough suppressant.  Patient with history of asthma, continue albuterol, add prednisone.  Final Clinical Impressions(s) / ED Diagnoses   Final diagnoses:  COVID-19 virus infection    ED Discharge Orders         Ordered    HYDROcodone-homatropine (HYCODAN) 5-1.5 MG/5ML syrup  Every 6 hours PRN     08/01/18 0416    predniSONE (DELTASONE) 20 MG tablet  Daily with breakfast     08/01/18 0416           Orpah Greek, MD 08/01/18 [phone]

## 2018-08-01 NOTE — ED Notes (Signed)
Patient going to CT

## 2019-03-29 ENCOUNTER — Ambulatory Visit: Payer: Self-pay | Attending: Internal Medicine

## 2019-03-29 DIAGNOSIS — Z23 Encounter for immunization: Secondary | ICD-10-CM | POA: Insufficient documentation

## 2019-03-29 NOTE — Progress Notes (Signed)
   Covid-19 Vaccination Clinic  Name:  Stephen Webster    MRN: [id number] DOB: [birth year]  03/29/2019  [name] was observed post Covid-19 immunization for 15 minutes without incident. He was provided with Vaccine Information Sheet and instruction to access the V-Safe system.   [name] was instructed to call 911 with any severe reactions post vaccine: Marland Kitchen Difficulty breathing  . Swelling of face and throat  . A fast heartbeat  . A bad rash all over body  . Dizziness and weakness   Immunizations Administered    Name Date Dose VIS Date Route   Pfizer COVID-19 Vaccine 03/29/2019  4:38 PM 0.3 mL 01/02/2019 Intramuscular   Manufacturer: [employer], [employer]   Lot: [id number]   NDC: [address]-1

## 2019-04-29 ENCOUNTER — Ambulatory Visit: Payer: Self-pay | Attending: Internal Medicine

## 2019-04-29 DIAGNOSIS — Z23 Encounter for immunization: Secondary | ICD-10-CM

## 2019-04-29 NOTE — Progress Notes (Signed)
   Covid-19 Vaccination Clinic  Name:  Stephen Webster    MRN: [id number] DOB: April 21, 1970  04/29/2019  Mr. Stephen Webster was observed post Covid-19 immunization for 15 minutes without incident. He was provided with Vaccine Information Sheet and instruction to access the V-Safe system.   Mr. Stephen Webster was instructed to call 911 with any severe reactions post vaccine: Marland Kitchen Difficulty breathing  . Swelling of face and throat  . A fast heartbeat  . A bad rash all over body  . Dizziness and weakness   Immunizations Administered    Name Date Dose VIS Date Route   Pfizer COVID-19 Vaccine 04/29/2019  8:51 AM 0.3 mL 01/02/2019 Intramuscular   Manufacturer: [employer], [employer]   Lot: [id number]   NDC: [address]-1

## 2020-08-03 IMAGING — DX PORTABLE CHEST - 1 VIEW
1 series · 1 of 1 positions shown · non-contrast
Comparison: Rib radiograph 06/10/2015

CLINICAL DATA: Runny nose and productive cough for 3 days.

EXAM:
PORTABLE CHEST 1 VIEW

[chest ap]
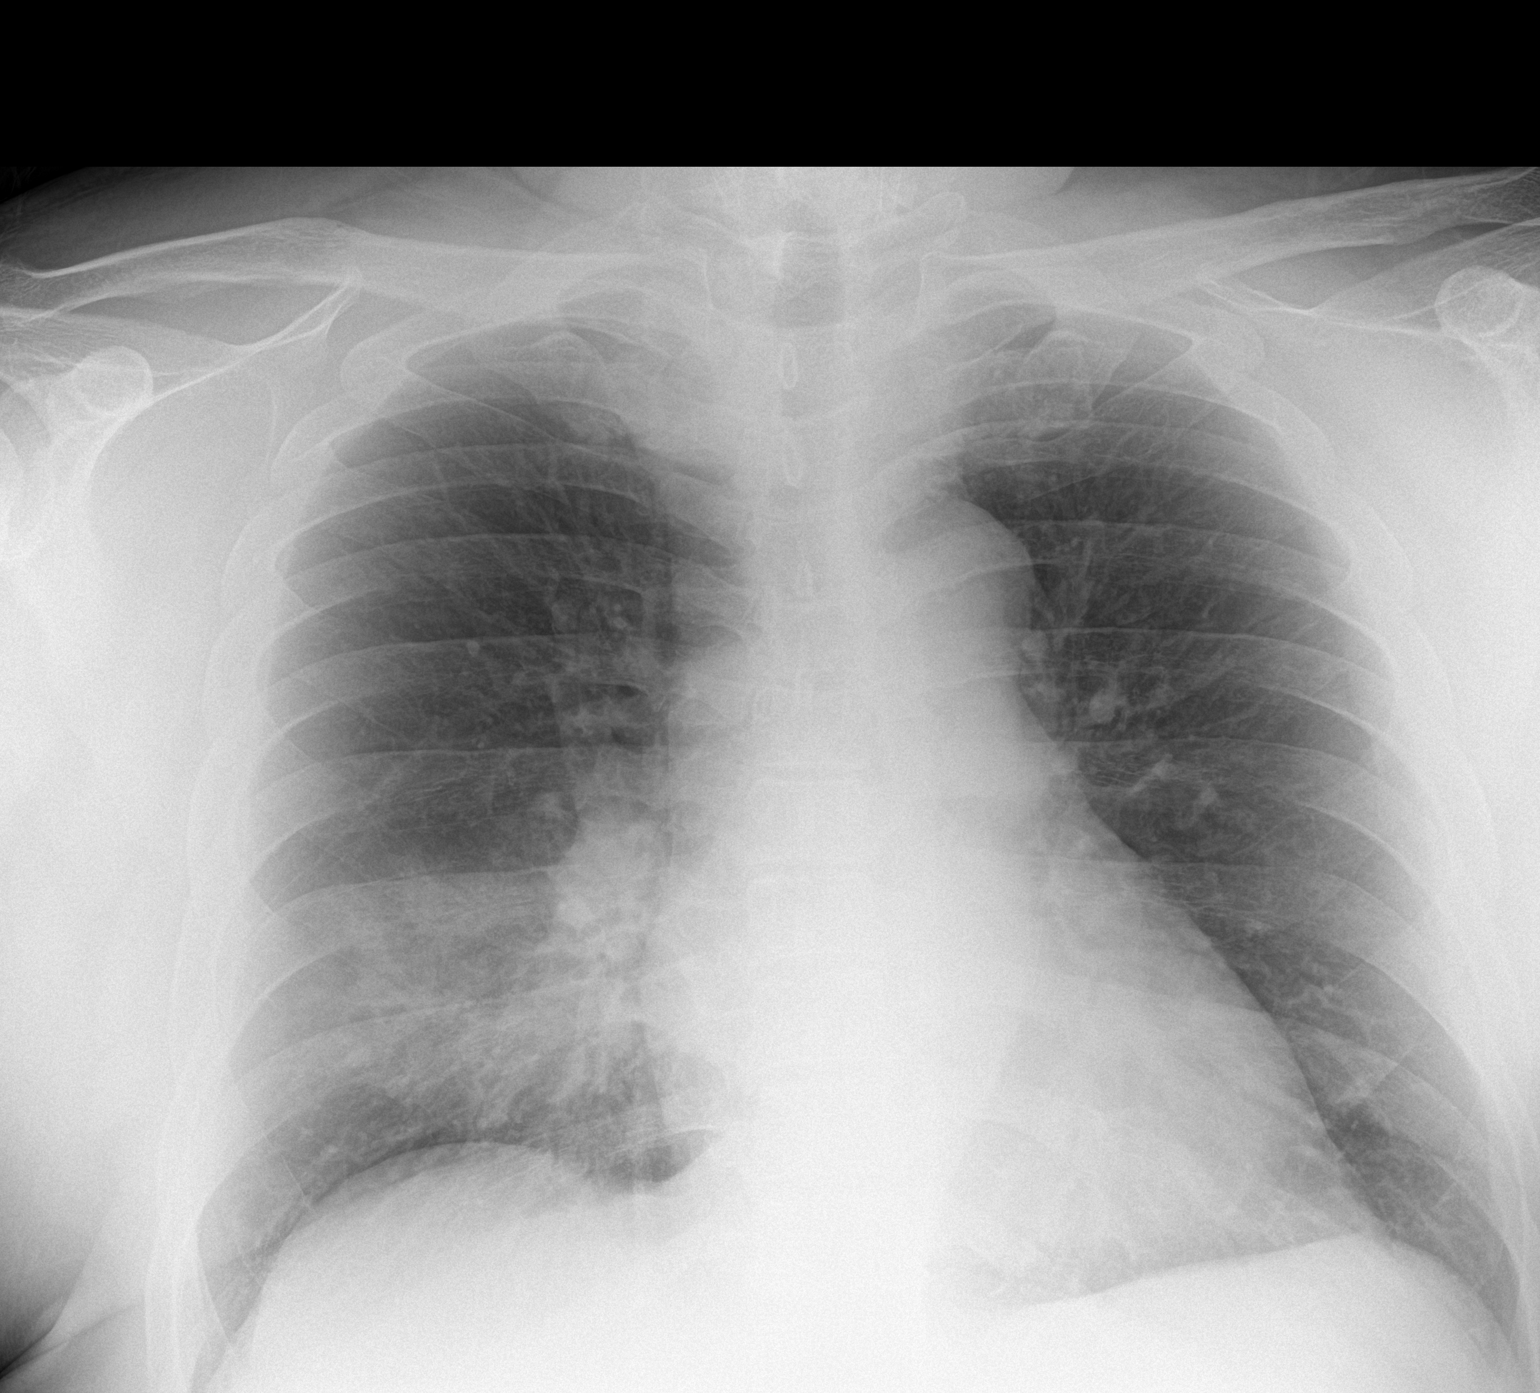

[1 of 1 positions shown; findings below may reference images not displayed]

FINDINGS: Cardiac contours upper limits of normal. There is a large masslike
area of consolidation within the right lower lung. No pleural
effusion or pneumothorax.
IMPRESSION: Large area of consolidation within the right mid and lower lung lung
concerning for pneumonia in the appropriate clinical setting.
Followup PA and lateral chest X-ray is recommended in 3-4 weeks
following trial of antibiotic therapy to ensure resolution and
exclude underlying malignancy.

## 2020-08-09 IMAGING — CT CT ANGIOGRAPHY CHEST
2 of 6 series · 18 of 46 positions shown · IV contrast (OMNIPAQUE)
Comparison: Radiograph 07/26/2018

CLINICAL DATA: PE suspected, high pretest prob. T0NGA-5B positive.
Fever.

EXAM:
CT ANGIOGRAPHY CHEST WITH CONTRAST
TECHNIQUE: Multidetector CT imaging of the chest was performed using the
standard protocol during bolus administration of intravenous
contrast. Multiplanar CT image reconstructions and MIPs were
obtained to evaluate the vascular anatomy.
CONTRAST:  100mL OMNIPAQUE IOHEXOL 350 MG/ML SOLN

[Series 5: thins · axial · 0.75mm/px · z∈[-376,-127]mm · 15 of 273 slices shown]
[im 12/273  lung]
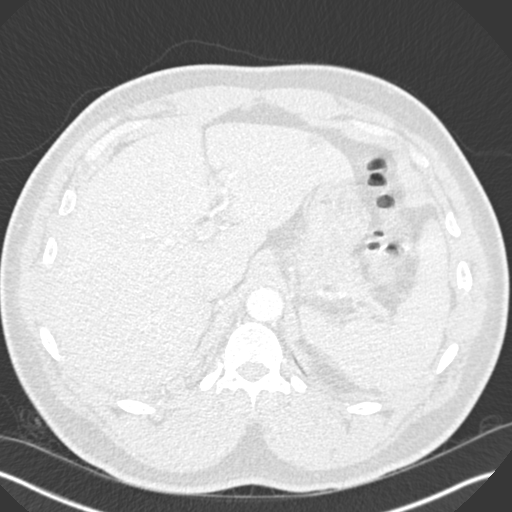
[im 36/273  soft-tissue]
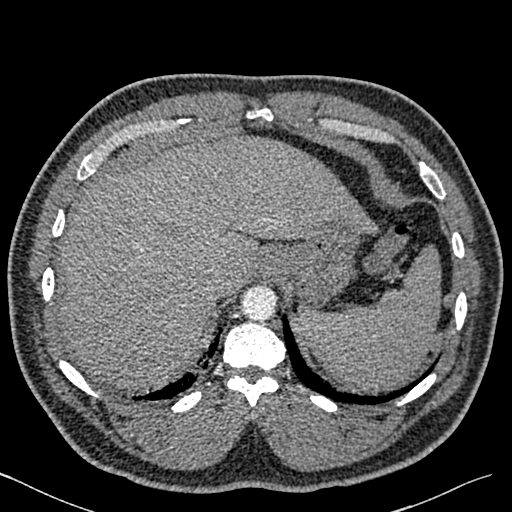
[im 48/273  lung]
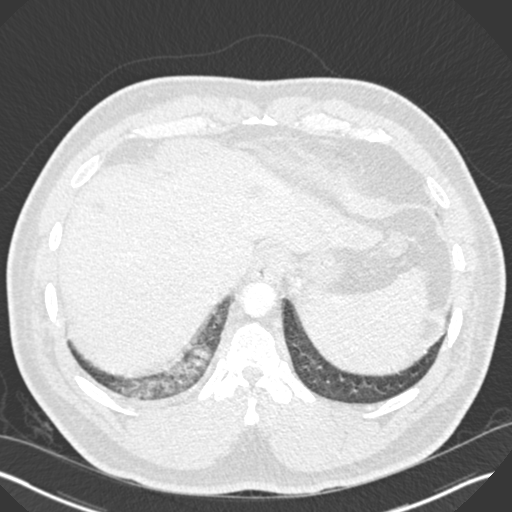
[im 71/273  soft-tissue]
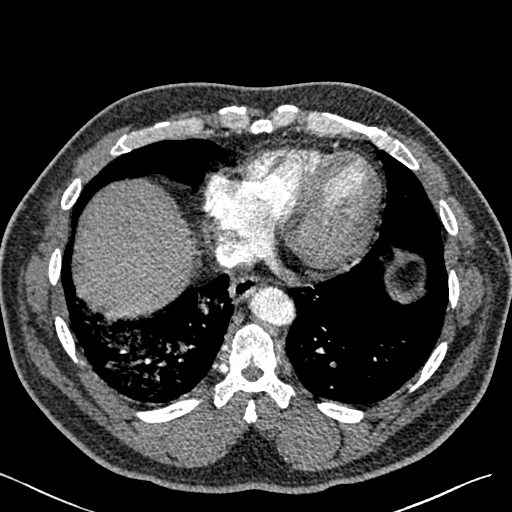
[im 83/273  lung]
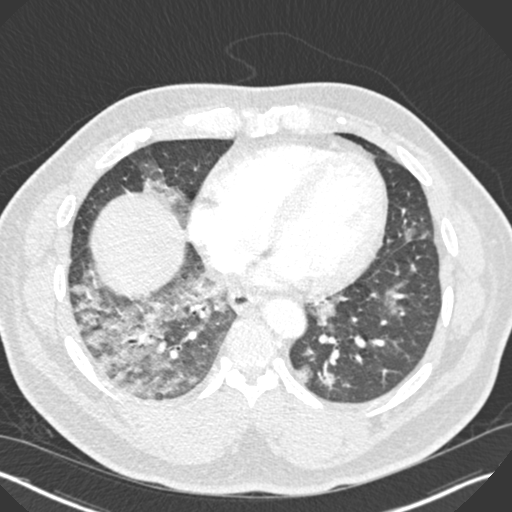
[im 107/273  soft-tissue]
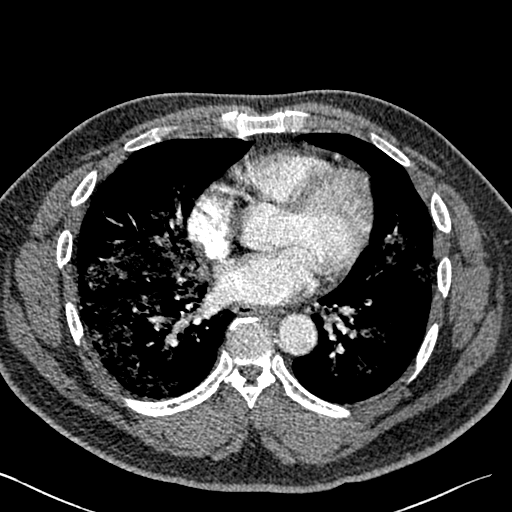
[im 119/273  lung]
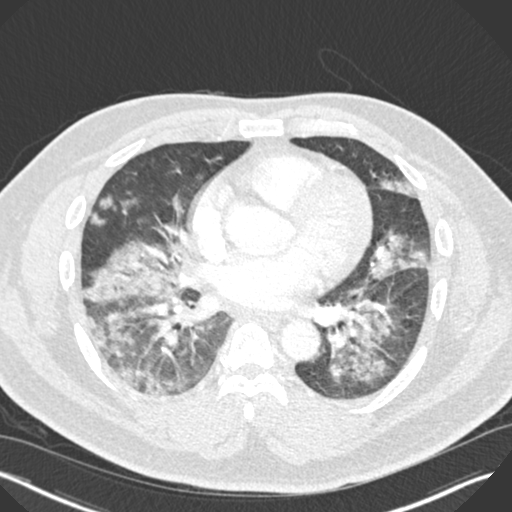
[im 142/273  soft-tissue]
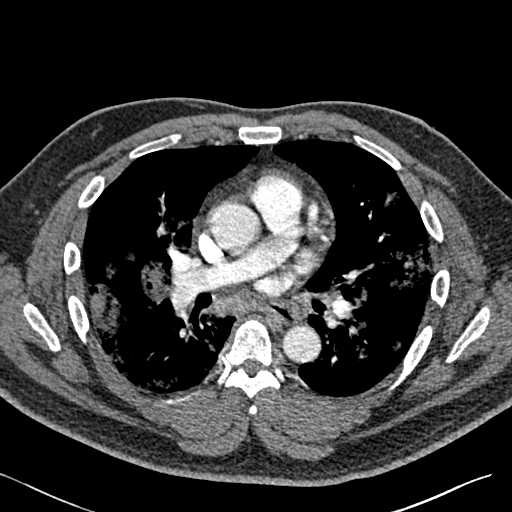
[im 154/273  lung]
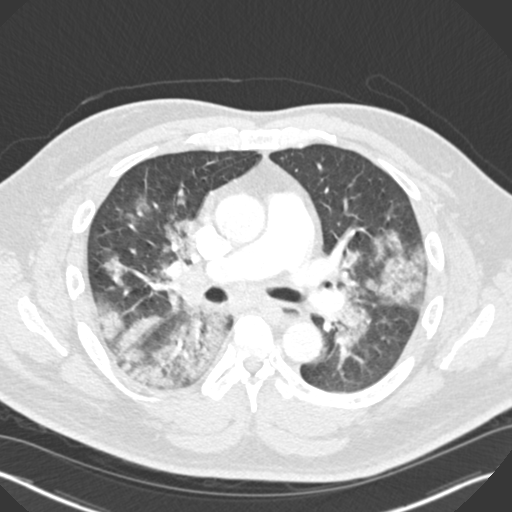
[im 166/273  soft-tissue]
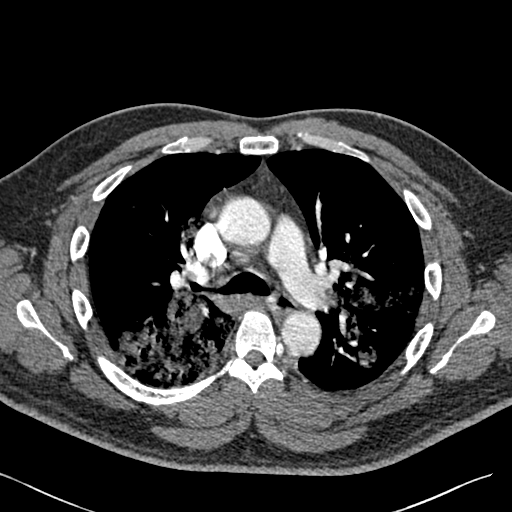
[im 190/273  lung]
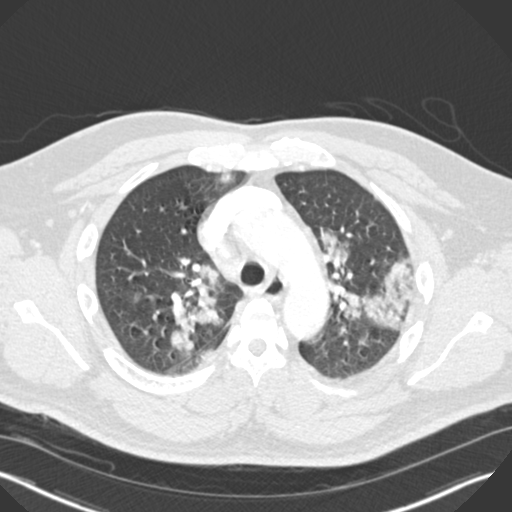
[im 202/273  soft-tissue]
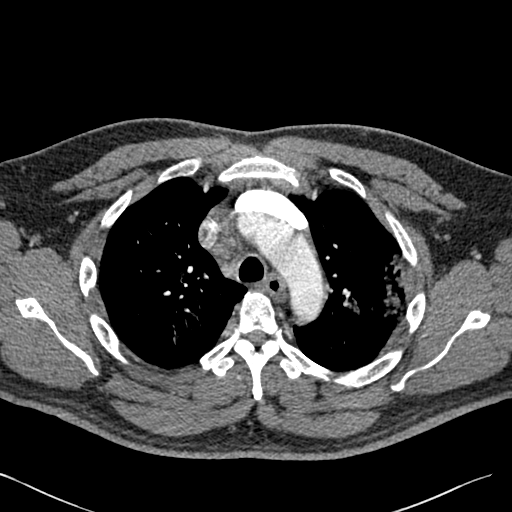
[im 225/273  lung]
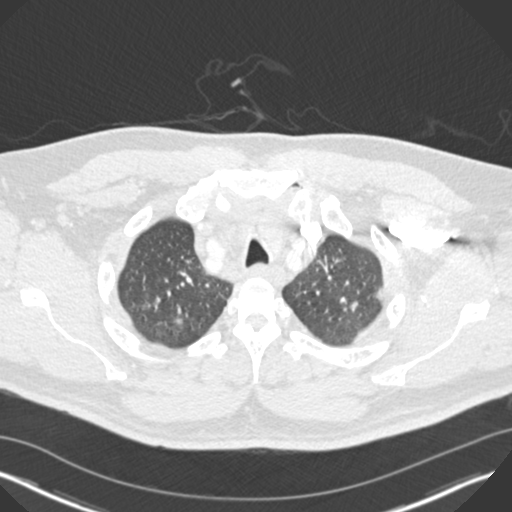
[im 237/273  soft-tissue]
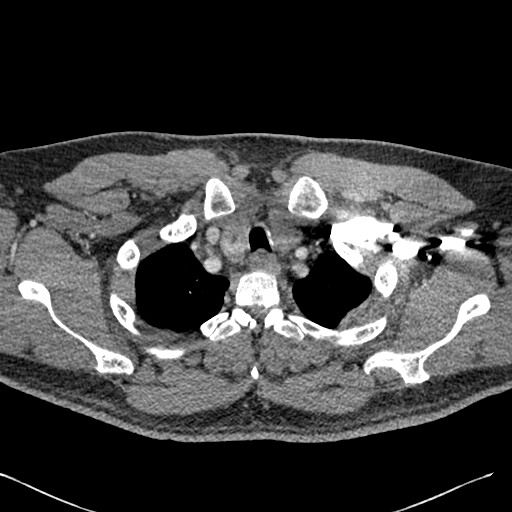
[im 261/273  lung]
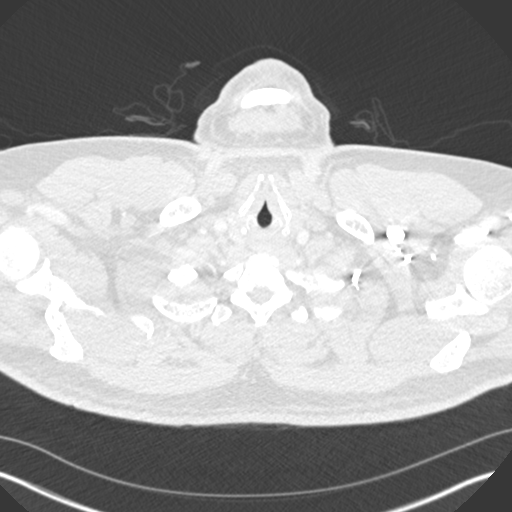

[Series 7: coronal mpr · coronal · 0.57mm/px · 3 of 144 slices shown]
[im 36/144  soft-tissue]
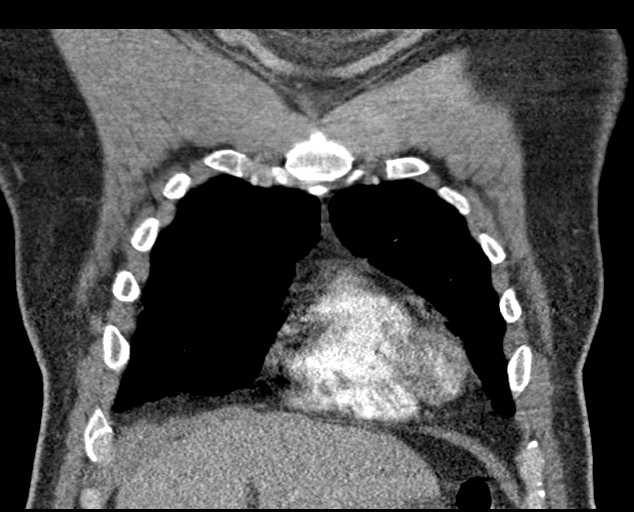
[im 72/144  soft-tissue]
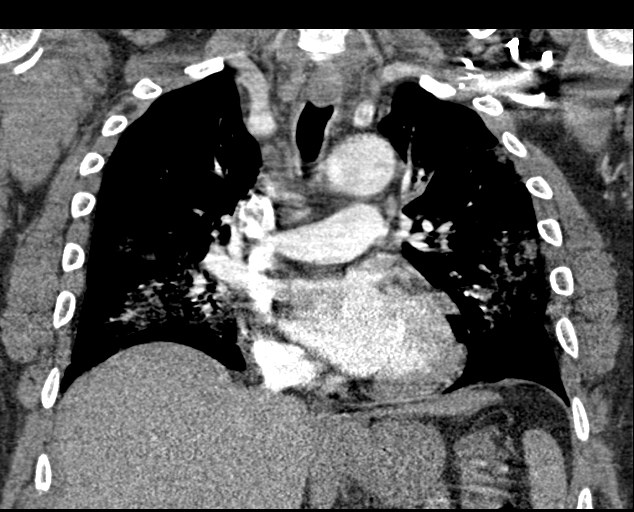
[im 108/144  soft-tissue]
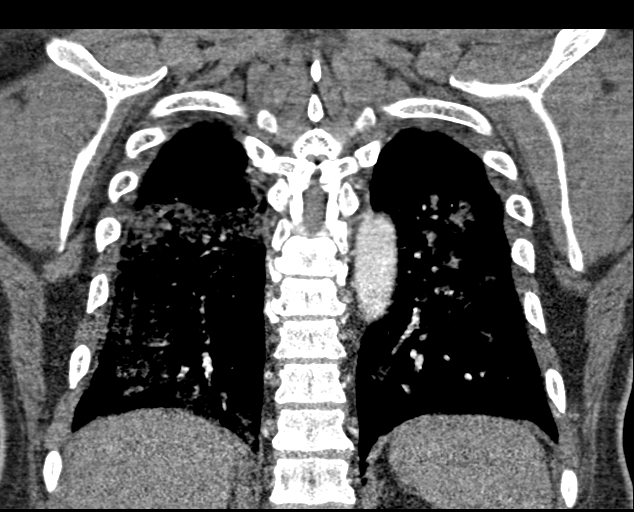

[18 of 46 positions shown; findings below may reference images not displayed]

FINDINGS: Cardiovascular: Contrast bolus timing is suboptimal for assessment
of pulmonary embolus distal to the lobar levels. No central filling
defects in the pulmonary arteries. Segmental and subsegmental
branches are not well assessed. Thoracic aorta is normal in caliber.
No evidence of aortic dissection. Heart is normal in size. No
pericardial effusion.

Mediastinum/Nodes: Prominent right lower paratracheal node at 13 mm.
Prominent subcarinal node at 14 mm. Esophagus slightly patulous.
Possible 16 mm right thyroid nodule.

Lungs/Pleura: Bilateral multifocal ground-glass and consolidative
opacities throughout all lobes of both lungs. Findings have
progressed from prior radiograph. Largest area of consolidation in
the perifissural right upper lobe. No septal thickening. No pleural
fluid. Trachea and mainstem bronchi are patent.

Upper Abdomen: Small low-density lesions in the liver, too small to
accurately characterize. No acute findings.

Musculoskeletal: There are no acute or suspicious osseous
abnormalities.

Review of the MIP images confirms the above findings.
IMPRESSION: 1. Moderate to marked bilateral ground-glass and consolidative lung
opacities consistent with provided history of T0NGA-5B pneumonia.
2. No central pulmonary embolus, evaluation of the pulmonary
arteries distal to the lobar level is suboptimal due to contrast
bolus timing.
3. Shotty mediastinal lymph nodes are likely reactive.
4. Suspected 16 mm right thyroid nodule. Recommend nonemergent
thyroid ultrasound after resolution of acute event.

## 2021-05-23 NOTE — Progress Notes (Signed)
? ?IDBoleslaus Webster, DOB 15-Jun-1970, MRN [id number] ? ?PCP:  Vernie Shanks, MD  ?Cardiologist:  Rex Kras, DO, [facility] (established care 05/24/2021) ? ?REASON FOR CONSULT: Chest pain ? ?REQUESTING PHYSICIAN:  ?Vernie Shanks, MD ?[place],  [place] [address] ? ?Chief Complaint  ?Patient presents with  ? Chest Pain  ? New Patient (Initial Visit)  ? Hypertension  ? ? ?HPI  ?Stephen Webster is a 51 y.o. [place] male whose past medical history and cardiovascular risk factors include: Hypertension, hyperlipidemia, prediabetes, history of peptic ulcer disease, sleep apnea on CPAP, obesity due to excess calories.  ? ?He is referred to the office at the request of Vernie Shanks, MD for evaluation of chest pain. ? ?Chest pain: ?Started approximately 2 weeks ago which woke him up from sleep left-sided chest pressure, 10 out of 10, symptoms lasted for more than an hour, nonradiating, and self-limited.  He chose not to go to the ER for no particular reason.  He followed up with PCP and was referred to cardiology for further evaluation and management. ? ?He continues to have at times intermittent chest discomfort usually with exertional related activities and gets better with resting.  The discomfort is predominantly over the left anterior precordium.  No active anginal discomfort. ? ?Primary team did check a troponin which was within normal limits. ? ?FUNCTIONAL STATUS: ?No structured exercise program or daily routine.  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 ? ?ALLERGIES: ?Allergies  ?Allergen Reactions  ? Aspirin Other (See Comments)  ?  Patient has an ulcer  ? Nsaids   ?  REACTION: abdominal pain  ? ? ?MEDICATION LIST PRIOR TO VISIT: ?Current Meds  ?Medication Sig  ? Albuterol Sulfate, sensor, 108 (90 Base) MCG/ACT AEPB Inhale into the lungs.  ? aspirin EC 81 MG tablet Take 1 tablet (81 mg total) by mouth daily. Swallow whole.  ?  budesonide-formoterol (SYMBICORT) 80-4.5 MCG/ACT inhaler Inhale 2 puffs into the lungs 2 (two) times daily.  ? diphenhydrAMINE (BENADRYL) 25 MG tablet Take 1 tablet (25 mg total) by mouth every 6 (six) hours.  ? lisinopril (ZESTRIL) 5 MG tablet Take 5 mg by mouth every morning.  ? loratadine (CLARITIN) 10 MG tablet Take by mouth.  ? metoprolol succinate (TOPROL XL) 25 MG 24 hr tablet Take 1 tablet (25 mg total) by mouth daily.  ? montelukast (SINGULAIR) 10 MG tablet Take 10 mg by mouth daily.  ? nitroGLYCERIN (NITROSTAT) 0.4 MG SL tablet Place 1 tablet (0.4 mg total) under the tongue every 5 (five) minutes as needed for chest pain. If you require more than two tablets five minutes apart go to the nearest ER via EMS.  ? rosuvastatin (CRESTOR) 20 MG tablet Take 20 mg by mouth daily.  ?  ? ?PAST MEDICAL HISTORY: ?Past Medical History:  ?Diagnosis Date  ? Arthritis   ? Asthma   ? Hyperlipidemia   ? Hypertension   ? Obesity   ? Prediabetes   ? PUD (peptic ulcer disease)   ? Sleep apnea   ? Vitamin D deficiency   ? ? ?PAST SURGICAL HISTORY: ?Past Surgical History:  ?Procedure Laterality Date  ? BACK SURGERY  2018  ? KNEE SURGERY    ? NECK SURGERY  2018  ? ? ?FAMILY HISTORY: ?The patient family history is not on file. ? ?SOCIAL HISTORY:  ?The patient  reports that  he has never smoked. He has never used smokeless tobacco. He reports that he does not drink alcohol and does not use drugs. ? ?REVIEW OF SYSTEMS: ?Review of Systems  ?Cardiovascular:  Positive for chest pain, dyspnea on exertion and leg swelling (better in the morning (chronic)). Negative for orthopnea, palpitations, paroxysmal nocturnal dyspnea and syncope.  ?Respiratory:  Positive for shortness of breath.   ?Hematologic/Lymphatic: Negative for bleeding problem.  ? ?PHYSICAL EXAM: ? ?  05/24/2021  ?  9:18 AM 08/01/2018  ?  4:18 AM 08/01/2018  ? 12:31 AM  ?Vitals with BMI  ?Height _0     ?Weight 289 lbs    ?BMI 41.47    ?Systolic 778 242 353  ?Diastolic 80 77 82   ?Pulse 63 102 94  ? ? ?CONSTITUTIONAL: Well-developed and well-nourished. No acute distress.  ?SKIN: Skin is warm and dry. No rash noted. No cyanosis. No pallor. No jaundice ?HEAD: Normocephalic and atraumatic.  ?EYES: No scleral icterus ?MOUTH/THROAT: Moist oral membranes.  ?NECK: No JVD present. No thyromegaly noted. No carotid bruits  ?LYMPHATIC: No visible cervical adenopathy.  ?CHEST Normal respiratory effort. No intercostal retractions  ?LUNGS: Clear to auscultation bilaterally.  No stridor. No wheezes. No rales.  ?CARDIOVASCULAR: Regular rate and rhythm, positive S1-S2, no murmurs rubs or gallops appreciated. ?ABDOMINAL: Obese, soft, nontender, nondistended, positive bowel sounds in all 4 quadrants, no apparent ascites.  ?EXTREMITIES: No peripheral edema, warm to touch, 2+ bilateral DP and PT pulses ?HEMATOLOGIC: No significant bruising ?NEUROLOGIC: Oriented to person, place, and time. Nonfocal. Normal muscle tone.  ?PSYCHIATRIC: Normal mood and affect. Normal behavior. Cooperative ? ?CARDIAC DATABASE: ?EKG: ?May 24, 2021: NSR, 66 bpm, nonspecific T wave abnormality, without underlying injury pattern.  ? ?Echocardiogram: ?No results found for this or any previous visit from the past 1095 days. ?  ? ?Stress Testing: ?No results found for this or any previous visit from the past 1095 days. ? ? ?Heart Catheterization: ?None ? ?LABORATORY DATA: ? ?  Latest Ref Rng & Units 08/01/2018  ? 12:41 AM 03/14/2013  ?  7:03 PM 03/14/2013  ?  5:50 PM  ?CBC  ?WBC 4.0 - 10.5 K/uL 6.0    9.9    ?Hemoglobin 13.0 - 17.0 g/dL 14.8   15.6   14.8    ?Hematocrit 39.0 - 52.0 % 44.6   46.0   42.9    ?Platelets 150 - 400 K/uL 192    188    ? ? ? ?  Latest Ref Rng & Units 08/01/2018  ? 12:41 AM 03/14/2013  ?  7:03 PM 03/14/2013  ?  5:50 PM  ?CMP  ?Glucose 70 - 99 mg/dL 112   97   115    ?BUN 6 - 20 mg/dL _1 ?Creatinine 0.61 - 1.24 mg/dL 1.41   1.10   1.08    ?Sodium 135 - 145 mmol/L 132   141   137    ?Potassium 3.5 - 5.1 mmol/L  4.1   3.8   3.9    ?Chloride 98 - 111 mmol/L 97   101   99    ?CO2 22 - 32 mmol/L 24    25    ?Calcium 8.9 - 10.3 mg/dL 8.6    9.3    ? ? ?Lipid Panel  ?   ?Component Value Date/Time  ? CHOL 228 (HH) 05/19/2007 0835  ? TRIG 119 05/19/2007 0835  ? HDL 28.3 (L) 05/19/2007  5053  ? CHOLHDL 8.1 CALC 05/19/2007 0835  ? VLDL 24 05/19/2007 0835  ? LDLDIRECT 172.7 05/19/2007 0835  ? ? ?No components found for: NTPROBNP ?No results for input(s): PROBNP in the last 8760 hours. ?No results for input(s): TSH in the last 8760 hours. ? ?BMP ?No results for input(s): NA, K, CL, CO2, GLUCOSE, BUN, CREATININE, CALCIUM, GFRNONAA, GFRAA in the last 8760 hours. ? ?HEMOGLOBIN A1C ?No results found for: HGBA1C, MPG ? ?External Labs:  ?Date Collected: 04/29/2020 , information obtained by referring physician. ?Potassium: 4.4 ?Creatinine 1.12 mg/dL. ?eGFR: 80 mL/min per 1.73 m? ?Lipid profile: Total cholesterol 130 , triglycerides 49 , HDL 36 , LDL 82, non-HDL 93 ?AST: 23 , ALT: 33 , alkaline phosphatase: 19  ?Hemoglobin A1c: 5.7 ? ?IMPRESSION: ? ?  ICD-10-CM   ?1. Angina pectoris (HCC)  I20.9 aspirin EC 81 MG tablet  ?  metoprolol succinate (TOPROL XL) 25 MG 24 hr tablet  ?  Basic metabolic panel  ?  Magnesium  ?  CBC  ?  nitroGLYCERIN (NITROSTAT) 0.4 MG SL tablet  ?  ?2. Dyspnea on exertion  R06.09 Pro b natriuretic peptide (BNP)  ?  CANCELED: Pro b natriuretic peptide (BNP)  ?  ?3. Benign hypertension  I10 EKG 12-Lead  ?  ?4. Mixed hyperlipidemia  E78.2   ?  ?5. Prediabetes  R73.03   ?  ?6. OSA on CPAP  G47.33   ? Z99.89   ?  ?7. Class 3 severe obesity due to excess calories with serious comorbidity and body mass index (BMI) of 40.0 to 44.9 in adult [facility])  E66.01   ? Z68.41   ?  ?  ? ?RECOMMENDATIONS: ?Stephen Webster is a 67 y.o. [place] male whose past medical history and cardiac risk factors include: Hypertension, hyperlipidemia, prediabetes, history of peptic ulcer disease, sleep apnea on CPAP, obesity due to excess calories.  ? ?Angina  pectoris ([place]) / Dyspnea on exertion ?Symptoms concerning for angina pectoris/unstable angina ?EKG: Nonischemic. ?No active chest pain but still has it with effort related activities. ?Recommended electi

## 2021-05-24 ENCOUNTER — Ambulatory Visit: Payer: 59 | Admitting: Cardiology

## 2021-05-24 ENCOUNTER — Encounter: Payer: Self-pay | Admitting: Cardiology

## 2021-05-24 VITALS — BP 115/80 | HR 63 | Temp 98.0°F | Resp 17 | Ht 70.0 in | Wt 289.0 lb

## 2021-05-24 DIAGNOSIS — R0609 Other forms of dyspnea: Secondary | ICD-10-CM

## 2021-05-24 DIAGNOSIS — E782 Mixed hyperlipidemia: Secondary | ICD-10-CM

## 2021-05-24 DIAGNOSIS — Z6841 Body Mass Index (BMI) 40.0 and over, adult: Secondary | ICD-10-CM

## 2021-05-24 DIAGNOSIS — G4733 Obstructive sleep apnea (adult) (pediatric): Secondary | ICD-10-CM

## 2021-05-24 DIAGNOSIS — R7303 Prediabetes: Secondary | ICD-10-CM

## 2021-05-24 DIAGNOSIS — I10 Essential (primary) hypertension: Secondary | ICD-10-CM

## 2021-05-24 DIAGNOSIS — I209 Angina pectoris, unspecified: Secondary | ICD-10-CM

## 2021-05-24 MED ORDER — ASPIRIN EC 81 MG PO TBEC: 81.0000 mg | DELAYED_RELEASE_TABLET | Freq: Every day | ORAL | 0 refills | Status: DC

## 2021-05-24 MED ORDER — NITROGLYCERIN 0.4 MG SL SUBL: 0.4000 mg | SUBLINGUAL_TABLET | SUBLINGUAL | 0 refills | Status: AC | PRN

## 2021-05-24 MED ORDER — METOPROLOL SUCCINATE ER 25 MG PO TB24: 25.0000 mg | ORAL_TABLET | Freq: Every day | ORAL | 0 refills | Status: DC

## 2021-05-25 LAB — BASIC METABOLIC PANEL
BUN/Creatinine Ratio: 17 (ref 9–20)
BUN: 20 mg/dL (ref 6–24)
CO2: 24 mmol/L (ref 20–29)
Calcium: 9 mg/dL (ref 8.7–10.2)
Chloride: 103 mmol/L (ref 96–106)
Creatinine, Ser: 1.18 mg/dL (ref 0.76–1.27)
Glucose: 82 mg/dL (ref 70–99)
Potassium: 4.4 mmol/L (ref 3.5–5.2)
Sodium: 140 mmol/L (ref 134–144)
eGFR: 75 mL/min/{1.73_m2} (ref >59)

## 2021-05-25 LAB — CBC
Hematocrit: 42.4 % (ref 37.5–51.0)
Hemoglobin: 14.7 g/dL (ref 13.0–17.7)
MCH: 30.8 pg (ref 26.6–33.0)
MCHC: 34.7 g/dL (ref 31.5–35.7)
MCV: 89 fL (ref 79–97)
Platelets: 160 10*3/uL (ref 150–450)
RBC: 4.77 x10E6/uL (ref 4.14–5.80)
RDW: 12.9 % (ref 11.6–15.4)
WBC: 7.2 10*3/uL (ref 3.4–10.8)

## 2021-05-25 LAB — PRO B NATRIURETIC PEPTIDE: NT-Pro BNP: <36 pg/mL (ref 0–121)

## 2021-05-25 LAB — MAGNESIUM: Magnesium: 2 mg/dL (ref 1.6–2.3)

## 2021-06-02 ENCOUNTER — Other Ambulatory Visit: Payer: Self-pay

## 2021-06-02 DIAGNOSIS — I209 Angina pectoris, unspecified: Secondary | ICD-10-CM

## 2021-06-07 ENCOUNTER — Ambulatory Visit: Payer: Self-pay | Admitting: Cardiology

## 2021-06-08 ENCOUNTER — Ambulatory Visit: Payer: Self-pay | Admitting: Cardiology

## 2021-06-27 ENCOUNTER — Ambulatory Visit (HOSPITAL_COMMUNITY): Admission: RE | Admit: 2021-06-27 | Payer: 59 | Source: Ambulatory Visit | Admitting: Cardiology

## 2021-06-27 ENCOUNTER — Encounter (HOSPITAL_COMMUNITY): Admission: RE | Payer: Self-pay | Source: Ambulatory Visit

## 2021-06-27 SURGERY — LEFT HEART CATH AND CORONARY ANGIOGRAPHY
Anesthesia: LOCAL

## 2021-06-27 NOTE — Progress Notes (Signed)
Called patient to tell of time change.  He states he will not be able to come for procedure tomorrow, he is not in town.  Instructed him to call office to get procedure rescheduled.

## 2021-07-03 ENCOUNTER — Ambulatory Visit: Payer: Self-pay | Admitting: Cardiology

## 2021-08-01 DIAGNOSIS — E1159 Type 2 diabetes mellitus with other circulatory complications: Secondary | ICD-10-CM | POA: Diagnosis not present

## 2021-08-01 DIAGNOSIS — E785 Hyperlipidemia, unspecified: Secondary | ICD-10-CM | POA: Diagnosis not present

## 2021-08-01 DIAGNOSIS — G4733 Obstructive sleep apnea (adult) (pediatric): Secondary | ICD-10-CM | POA: Diagnosis not present

## 2021-08-01 DIAGNOSIS — I10 Essential (primary) hypertension: Secondary | ICD-10-CM | POA: Diagnosis not present

## 2021-08-01 DIAGNOSIS — Z79899 Other long term (current) drug therapy: Secondary | ICD-10-CM | POA: Diagnosis not present

## 2021-08-19 ENCOUNTER — Other Ambulatory Visit: Payer: Self-pay | Admitting: Cardiology

## 2021-08-19 DIAGNOSIS — I209 Angina pectoris, unspecified: Secondary | ICD-10-CM

## 2021-09-08 ENCOUNTER — Ambulatory Visit: Payer: BC Managed Care – PPO | Admitting: Internal Medicine

## 2021-09-08 ENCOUNTER — Encounter: Payer: Self-pay | Admitting: Internal Medicine

## 2021-09-08 VITALS — BP 120/80 | HR 56 | Ht 70.0 in | Wt 286.8 lb

## 2021-09-08 DIAGNOSIS — I200 Unstable angina: Secondary | ICD-10-CM | POA: Diagnosis not present

## 2021-09-08 DIAGNOSIS — R0602 Shortness of breath: Secondary | ICD-10-CM | POA: Diagnosis not present

## 2021-09-08 DIAGNOSIS — G4733 Obstructive sleep apnea (adult) (pediatric): Secondary | ICD-10-CM

## 2021-09-08 DIAGNOSIS — Z9989 Dependence on other enabling machines and devices: Secondary | ICD-10-CM | POA: Diagnosis not present

## 2021-09-08 NOTE — Progress Notes (Signed)
Stephen Webster    [id number]    [birth year]  Primary Care Physician:Wong, Maryla Morrow, MD (Inactive)  Referring Physician: Moshe Cipro, NP [address] [place],  [place] [address] Reason for Consultation: long haul covid Date of Consultation: 09/08/2021  Chief complaint:   Chief Complaint  Patient presents with   Consult    Consult: Covid long hauler, Chest tightness, SOB      HPI: Stephen Webster is a 51 y.o. man who presents for new patient evaluation for shortness of breath and chest tightness after his covid infection.   He had covid in July 2020 and was treated supportively in the outpatient setting at that time. Was not hypoxemic and did not require hospitalization. CTPE study was negative for PE.   He woke up in the middle of the night with chest pain a couple months ago. Saw Dr. Jacinto Halim. He was going to have a heart catheterization but had to postpone due to insurance issues.   He experiences tightness in his chest on the left side. This occurs at rest and with exertion, with stress. Symptoms last for 5-10 minutes and resolves spontaneously. He has not taken nitroglycerin yet for these symptoms yet. He is also short of breath with exertion, gets light headed when he leans forward and stands up too fast.   He is currently having chest tightness just sitting in my office at rest.   He denies wheezing. He has a cough which is usually in the morning and he feels it is related to allergies as he also has chronic rhinitis with frequent throat clearing.   He wears CPAP - follows with Dr. Earl Gala at [facility] for this. Feels it is well controlled and improvement in his OSA symptoms.   He has been prescribed an albuterol inhaler which he takes in the spring when his allergies get worse his breathing gets worse. He has received SCIT before and has been given prednisone in the past for bronchitis but nothing in the last year.    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  Exposures: lives at home with wife and two kids. Outside dogs.  Smoking history: never smoker.   Social History   Occupational History   Not on file  Tobacco Use   Smoking status: Never   Smokeless tobacco: Never  Vaping Use   Vaping Use: Never used  Substance and Sexual Activity   Alcohol use: No   Drug use: No   Sexual activity: Not on file    Relevant family history:  Family History  Problem Relation Age of Onset   Childhood respiratory disease Daughter     Past Medical History:  Diagnosis Date   Arthritis    Asthma    Hyperlipidemia    Hypertension    Obesity    Prediabetes    PUD (peptic ulcer disease)    Sleep apnea    Vitamin D deficiency     Past Surgical History:  Procedure Laterality Date   BACK SURGERY  2018   KNEE SURGERY     NECK SURGERY  2018     Physical Exam: Blood pressure 120/80, pulse (!) 56, height 5\' 10"  (1.778 m), weight 286 lb 12.8 oz (130.1 kg), SpO2 94 %. Gen:      No acute distress ENT:  no nasal polyps, mucus membranes moist Lungs:    No increased respiratory effort, symmetric chest wall excursion, clear to auscultation  bilaterally, no wheezes or crackles CV:         Regular rate and rhythm; no murmurs, rubs, or gallops.  No pedal edema Abd:      + bowel sounds;, non-tender; obese, soft MSK: no acute synovitis of DIP or PIP joints, no mechanics hands.  Skin:      Warm and dry; no rashes Neuro: normal speech, no focal facial asymmetry Psych: alert and oriented x3, normal mood and affect   Data Reviewed/Medical Decision Making:  Independent interpretation of tests: Imaging:  Review of patient's CTPE  images July 2020 revealed bilateral GGOs consistent with covid infection and no PE. The patient's images have been independently reviewed by me.    PFTs:    Labs:  Lab Results  Component Value Date   WBC 7.2 05/24/2021   HGB 14.7 05/24/2021   HCT 42.4 05/24/2021   MCV 89 05/24/2021    PLT 160 05/24/2021   Lab Results  Component Value Date   NA 140 05/24/2021   K 4.4 05/24/2021   CL 103 05/24/2021   CO2 24 05/24/2021     Immunization status:  Immunization History  Administered Date(s) Administered   Influenza Split 02/15/2021   Influenza,inj,Quad PF,6+ Mos 01/12/2015, 11/15/2015   Influenza,inj,Quad PF,6-35 Mos 10/26/2019   PFIZER(Purple Top)SARS-COV-2 Vaccination 03/29/2019, 04/29/2019, 10/26/2019   Pfizer Covid-19 Vaccine Bivalent Booster 64yrs & up 02/15/2021     I reviewed prior external note(s) from cardiology, ED visit, PCP  I reviewed the result(s) of the labs and imaging as noted above.   I have ordered PFT   Assessment:  Chest pain, suspicious for angina Shortness of breath Mild intermittent asthma Chronic allergic rhinitis  Plan/Recommendations: I do not think his left sided chest pain which woke him up from his sleep is related to his asthma or long covid symptoms. Recommend follow up with cardiology asap to reschedule heart cath and start taking NG SL as needed for symptoms.  Will obtain a full set of PFTs to evaluate for intrinsic lung disease  Probably has chronic allergic rhinitis that is sub-optimally controlled. Can trial nasal sprays at next visit.   We discussed disease management and progression at length today.     Return to Care: Return in about 4 weeks (around 10/06/2021).  Durel Salts, MD Pulmonary and Critical Care Medicine [facility] Office:[phone]  CC: Moshe Cipro, NP

## 2021-09-08 NOTE — Patient Instructions (Signed)
Please schedule follow up scheduled with myself in 1 months.  If my schedule is not open yet, we will contact you with a reminder closer to that time. Please call [phone] if you haven't heard from [place] a month before.   Before your next visit I would like you to have: Full set of PFTs - follow up with me afterwards.  I recommend you call the cardiologist office and follow up about your heart catheterization.  I recommend taking the nitroglycerin as needed if you get chest pain. Your doctors need to know if your symptoms get better with the treatment.

## 2021-09-27 ENCOUNTER — Other Ambulatory Visit: Payer: Self-pay | Admitting: Cardiology

## 2021-09-27 DIAGNOSIS — I209 Angina pectoris, unspecified: Secondary | ICD-10-CM | POA: Diagnosis not present

## 2021-09-28 LAB — CBC
Hematocrit: 43.6 % (ref 37.5–51.0)
Hemoglobin: 14.6 g/dL (ref 13.0–17.7)
MCH: 30.2 pg (ref 26.6–33.0)
MCHC: 33.5 g/dL (ref 31.5–35.7)
MCV: 90 fL (ref 79–97)
Platelets: 158 10*3/uL (ref 150–450)
RBC: 4.83 x10E6/uL (ref 4.14–5.80)
RDW: 13.2 % (ref 11.6–15.4)
WBC: 7.3 10*3/uL (ref 3.4–10.8)

## 2021-09-28 LAB — BASIC METABOLIC PANEL
BUN/Creatinine Ratio: 14 (ref 9–20)
BUN: 18 mg/dL (ref 6–24)
CO2: 24 mmol/L (ref 20–29)
Calcium: 9.6 mg/dL (ref 8.7–10.2)
Chloride: 105 mmol/L (ref 96–106)
Creatinine, Ser: 1.26 mg/dL (ref 0.76–1.27)
Glucose: 106 mg/dL — ABNORMAL HIGH (ref 70–99)
Potassium: 4.3 mmol/L (ref 3.5–5.2)
Sodium: 142 mmol/L (ref 134–144)
eGFR: 69 mL/min/{1.73_m2} (ref >59)

## 2021-09-28 LAB — PRO B NATRIURETIC PEPTIDE: NT-Pro BNP: 111 pg/mL (ref 0–121)

## 2021-09-28 LAB — MAGNESIUM: Magnesium: 2.2 mg/dL (ref 1.6–2.3)

## 2021-10-03 ENCOUNTER — Ambulatory Visit (HOSPITAL_COMMUNITY): Admission: RE | Admit: 2021-10-03 | Payer: BC Managed Care – PPO | Source: Ambulatory Visit | Admitting: Cardiology

## 2021-10-03 ENCOUNTER — Encounter (HOSPITAL_COMMUNITY): Admission: RE | Payer: Self-pay | Source: Ambulatory Visit

## 2021-10-03 SURGERY — LEFT HEART CATH AND CORONARY ANGIOGRAPHY
Anesthesia: LOCAL

## 2021-10-06 ENCOUNTER — Other Ambulatory Visit: Payer: Self-pay | Admitting: Cardiology

## 2021-10-06 DIAGNOSIS — I209 Angina pectoris, unspecified: Secondary | ICD-10-CM

## 2021-10-19 ENCOUNTER — Ambulatory Visit: Payer: BC Managed Care – PPO | Admitting: Cardiology

## 2021-10-26 ENCOUNTER — Ambulatory Visit: Payer: BC Managed Care – PPO | Admitting: Cardiology

## 2021-10-26 ENCOUNTER — Encounter: Payer: Self-pay | Admitting: Cardiology

## 2021-10-26 VITALS — BP 118/81 | HR 61 | Temp 98.3°F | Resp 16 | Ht 70.0 in | Wt 276.0 lb

## 2021-10-26 DIAGNOSIS — G4733 Obstructive sleep apnea (adult) (pediatric): Secondary | ICD-10-CM

## 2021-10-26 DIAGNOSIS — E782 Mixed hyperlipidemia: Secondary | ICD-10-CM | POA: Diagnosis not present

## 2021-10-26 DIAGNOSIS — I10 Essential (primary) hypertension: Secondary | ICD-10-CM | POA: Diagnosis not present

## 2021-10-26 DIAGNOSIS — R7303 Prediabetes: Secondary | ICD-10-CM

## 2021-10-26 DIAGNOSIS — R072 Precordial pain: Secondary | ICD-10-CM

## 2021-10-26 NOTE — Progress Notes (Signed)
ID:  Stephen Webster, DOB 03/22/1970, MRN [id number]  PCP:  Faustino Congress, NP  Cardiologist:  Rex Kras, DO, [facility] (established care 05/24/2021)  Date: 10/26/21 Last Office Visit: 05/24/2021  Chief Complaint  Patient presents with   Chest Pain   Follow-up    2 week    HPI  Stephen Webster is a 51 y.o. [place] male whose past medical history and cardiovascular risk factors include: Hypertension, hyperlipidemia, prediabetes, history of peptic ulcer disease, sleep apnea on CPAP, obesity due to excess calories.   Patient presents to the office for reevaluation of chest pain.  Accompanied by his wife.  Patient states that the chest pain continues to occur on a regular basis, at least once a week, intermittent, left-sided, tightness like sensation, lasting for 20 minutes, usually self-limited.  The pain is not brought on by effort related activities but does improve with resting and relaxing.  No use of sublingual nitroglycerin tablets.  At the last office visit he was recommended to undergo angiography due to new onset of severe chest pain concerning for angina pectoris.  However, he never followed through with the angiography and now presents for reevaluation.  When compared to May 2023 the pain is still present but overall intensity/frequency/duration has not worsened.  His wife is attributing the discomfort to increased amount of stress at work.  FUNCTIONAL STATUS: No structured exercise program or daily routine.  ALLERGIES: Allergies  Allergen Reactions   Aspirin Other (See Comments)    Patient has an ulcer (low dose is tolerable)   Nsaids     Abdominal pain    MEDICATION LIST PRIOR TO VISIT: Current Meds  Medication Sig   albuterol (VENTOLIN HFA) 108 (90 Base) MCG/ACT inhaler Inhale 2 puffs into the lungs every 4 (four) hours as needed for wheezing or shortness of breath.   ASPIRIN LOW DOSE 81 MG tablet TAKE 1 TABLET BY MOUTH EVERY DAY *SWALLOW WHOLE* (Patient taking  differently: Take 81 mg by mouth every evening.)   diphenhydrAMINE (BENADRYL) 25 MG tablet Take 1 tablet (25 mg total) by mouth every 6 (six) hours. (Patient taking differently: Take 25 mg by mouth daily as needed for allergies.)   fluticasone (FLOVENT HFA) 110 MCG/ACT inhaler Inhale 1 puff into the lungs 2 (two) times daily as needed (respiratory issues.).   lisinopril (ZESTRIL) 5 MG tablet Take 5 mg by mouth every morning.   loratadine (CLARITIN) 10 MG tablet Take 10 mg by mouth daily as needed for allergies.   metoprolol succinate (TOPROL-XL) 25 MG 24 hr tablet TAKE 1 TABLET (25 MG TOTAL) BY MOUTH DAILY.   montelukast (SINGULAIR) 10 MG tablet Take 10 mg by mouth every evening.   nitroGLYCERIN (NITROSTAT) 0.4 MG SL tablet Place 1 tablet (0.4 mg total) under the tongue every 5 (five) minutes as needed for chest pain. If you require more than two tablets five minutes apart go to the nearest ER via EMS.   rosuvastatin (CRESTOR) 20 MG tablet Take 20 mg by mouth daily.     PAST MEDICAL HISTORY: Past Medical History:  Diagnosis Date   Arthritis    Asthma    Hyperlipidemia    Hypertension    Obesity    Prediabetes    PUD (peptic ulcer disease)    Sleep apnea    Vitamin D deficiency     PAST SURGICAL HISTORY: Past Surgical History:  Procedure Laterality Date   BACK SURGERY  2018   KNEE SURGERY     NECK SURGERY  2018    FAMILY HISTORY: The patient family history includes Childhood respiratory disease in his daughter.  SOCIAL HISTORY:  The patient  reports that he has never smoked. He has never used smokeless tobacco. He reports that he does not drink alcohol and does not use drugs.  REVIEW OF SYSTEMS: Review of Systems  Cardiovascular:  Positive for chest pain. Negative for claudication, dyspnea on exertion, irregular heartbeat, leg swelling, near-syncope, orthopnea, palpitations, paroxysmal nocturnal dyspnea and syncope.  Respiratory:  Negative for shortness of breath.    Hematologic/Lymphatic: Negative for bleeding problem.  Musculoskeletal:  Negative for muscle cramps and myalgias.  Neurological:  Negative for dizziness and light-headedness.    PHYSICAL EXAM:    10/26/2021    1:31 PM 09/08/2021   10:16 AM 05/24/2021    9:18 AM  Vitals with BMI  Height 5' 10"  5' 10"  5' 10"   Weight 276 lbs 286 lbs 13 oz 289 lbs  BMI 39.6 18.29 93.71  Systolic 696 789 381  Diastolic 81 80 80  Pulse 61 56 63    Physical Exam  Constitutional: No distress.  Age appropriate, hemodynamically stable.   Neck: No JVD present.  Cardiovascular: Normal rate, regular rhythm, S1 normal, S2 normal, intact distal pulses and normal pulses. Exam reveals no gallop, no S3 and no S4.  No murmur heard. Pulmonary/Chest: Effort normal and breath sounds normal. No stridor. He has no wheezes. He has no rales.  Abdominal: Soft. Bowel sounds are normal. He exhibits no distension. There is no abdominal tenderness.  Obese  Musculoskeletal:        General: No edema.     Cervical back: Neck supple.  Neurological: He is alert and oriented to person, place, and time. He has intact cranial nerves (2-12).  Skin: Skin is warm and moist.   CARDIAC DATABASE: EKG: May 24, 2021: NSR, 66 bpm, nonspecific T wave abnormality, without underlying injury pattern.   Echocardiogram: No results found for this or any previous visit from the past 1095 days.    Stress Testing: No results found for this or any previous visit from the past 1095 days.   Heart Catheterization: None  LABORATORY DATA:    Latest Ref Rng & Units 09/27/2021    8:46 AM 05/24/2021   10:46 AM 08/01/2018   12:41 AM  CBC  WBC 3.4 - 10.8 x10E3/uL 7.3  7.2  6.0   Hemoglobin 13.0 - 17.7 g/dL 14.6  14.7  14.8   Hematocrit 37.5 - 51.0 % 43.6  42.4  44.6   Platelets 150 - 450 x10E3/uL 158  160  192        Latest Ref Rng & Units 09/27/2021    8:46 AM 05/24/2021   10:46 AM 08/01/2018   12:41 AM  CMP  Glucose 70 - 99 mg/dL 106  82  112    BUN 6 - 24 mg/dL 18  20  14    Creatinine 0.76 - 1.27 mg/dL 1.26  1.18  1.41   Sodium 134 - 144 mmol/L 142  140  132   Potassium 3.5 - 5.2 mmol/L 4.3  4.4  4.1   Chloride 96 - 106 mmol/L 105  103  97   CO2 20 - 29 mmol/L 24  24  24    Calcium 8.7 - 10.2 mg/dL 9.6  9.0  8.6     No components found for: "NTPROBNP" Recent Labs    05/24/21 1047 09/27/21 0846  PROBNP <36 111   No results for input(s): "  TSH" in the last 8760 hours.  BMP Recent Labs    05/24/21 1046 09/27/21 0846  NA 140 142  K 4.4 4.3  CL 103 105  CO2 24 24  GLUCOSE 82 106*  BUN 20 18  CREATININE 1.18 1.26  CALCIUM 9.0 9.6    HEMOGLOBIN A1C No results found for: "HGBA1C", "MPG"  External Labs:  Date Collected: 04/29/2020 , information obtained by referring physician. Potassium: 4.4 Creatinine 1.12 mg/dL. eGFR: 80 mL/min per 1.73 m Lipid profile: Total cholesterol 130 , triglycerides 49 , HDL 36 , LDL 82, non-HDL 93 AST: 23 , ALT: 33 , alkaline phosphatase: 19  Hemoglobin A1c: 5.7  IMPRESSION:    ICD-10-CM   1. Precordial pain  R07.2 PCV ECHOCARDIOGRAM COMPLETE    CT CORONARY MORPH W/CTA COR W/SCORE W/CA W/CM &/OR WO/CM    Basic metabolic panel    2. Benign hypertension  I10     3. Mixed hyperlipidemia  E78.2     4. Prediabetes  R73.03     5. OSA on CPAP  G47.33     6. Class 2 severe obesity due to excess calories with serious comorbidity and body mass index (BMI) of 39.0 to 39.9 in adult [facility])  E66.01    Z68.39        RECOMMENDATIONS: Sallie Maker is a 51 y.o. [place] male whose past medical history and cardiac risk factors include: Hypertension, hyperlipidemia, prediabetes, history of peptic ulcer disease, sleep apnea on CPAP, obesity due to excess calories.   Precordial pain Symptoms suggestive of both cardiac and noncardiac etiologies. Present since April/May 2023. Overall intensity/frequency/duration unchanged. No use of sublingual nitroglycerin tablets Echo will be ordered to  evaluate for structural heart disease and left ventricular systolic function. Coronary CTA to evaluate for obstructive CAD. Educated on seeking medical attention sooner by going to the closest ER via EMS if the symptoms increase in intensity, frequency, duration, or has typical chest pain as discussed in the office.  Patient verbalized understanding.  Benign hypertension Office blood pressures are well controlled No changes warranted at this time  Mixed hyperlipidemia Currently on rosuvastatin.   He denies myalgia or other side effects. Currently managed by primary care provider.  Prediabetes Reemphasized importance of glycemic control. Currently managed by primary care provider.  OSA on CPAP Reemphasized the importance of CPAP compliance. Currently managed by Dr. Nehemiah Settle.  Class 2 severe obesity due to excess calories with serious comorbidity and body mass index (BMI) of 39.0 to 39.9 in adult [facility]) Has lost weight compared to last office visit. Body mass index is 39.6 kg/m. I reviewed with the patient the importance of diet, regular physical activity/exercise, weight loss.   Patient is educated on increasing physical activity gradually as tolerated.  With the goal of moderate intensity exercise for 30 minutes a day 5 days a week.  FINAL MEDICATION LIST END OF ENCOUNTER: No orders of the defined types were placed in this encounter.   There are no discontinued medications.    Current Outpatient Medications:    albuterol (VENTOLIN HFA) 108 (90 Base) MCG/ACT inhaler, Inhale 2 puffs into the lungs every 4 (four) hours as needed for wheezing or shortness of breath., Disp: , Rfl:    ASPIRIN LOW DOSE 81 MG tablet, TAKE 1 TABLET BY MOUTH EVERY DAY *SWALLOW WHOLE* (Patient taking differently: Take 81 mg by mouth every evening.), Disp: 90 tablet, Rfl: 0   diphenhydrAMINE (BENADRYL) 25 MG tablet, Take 1 tablet (25 mg total) by mouth  every 6 (six) hours. (Patient taking differently:  Take 25 mg by mouth daily as needed for allergies.), Disp: 20 tablet, Rfl: 0   fluticasone (FLOVENT HFA) 110 MCG/ACT inhaler, Inhale 1 puff into the lungs 2 (two) times daily as needed (respiratory issues.)., Disp: , Rfl:    lisinopril (ZESTRIL) 5 MG tablet, Take 5 mg by mouth every morning., Disp: , Rfl:    loratadine (CLARITIN) 10 MG tablet, Take 10 mg by mouth daily as needed for allergies., Disp: , Rfl:    metoprolol succinate (TOPROL-XL) 25 MG 24 hr tablet, TAKE 1 TABLET (25 MG TOTAL) BY MOUTH DAILY., Disp: 90 tablet, Rfl: 0   montelukast (SINGULAIR) 10 MG tablet, Take 10 mg by mouth every evening., Disp: , Rfl:    nitroGLYCERIN (NITROSTAT) 0.4 MG SL tablet, Place 1 tablet (0.4 mg total) under the tongue every 5 (five) minutes as needed for chest pain. If you require more than two tablets five minutes apart go to the nearest ER via EMS., Disp: 30 tablet, Rfl: 0   rosuvastatin (CRESTOR) 20 MG tablet, Take 20 mg by mouth daily., Disp: , Rfl:   Orders Placed This Encounter  Procedures   CT CORONARY MORPH W/CTA COR W/SCORE W/CA W/CM &/OR WO/CM   Basic metabolic panel   PCV ECHOCARDIOGRAM COMPLETE    There are no Patient Instructions on file for this visit.   --Continue cardiac medications as reconciled in final medication list. --Return in about 4 weeks (around 11/23/2021) for Reevaluation of, Chest pain, Review test results. Or sooner if needed. --Continue follow-up with your primary care physician regarding the management of your other chronic comorbid conditions.  Patient's questions and concerns were addressed to his satisfaction. He voices understanding of the instructions provided during this encounter.   This note was created using a voice recognition software as a result there may be grammatical errors inadvertently enclosed that do not reflect the nature of this encounter. Every attempt is made to correct such errors.  Rex Kras, [place], [facility]  Pager: [phone] Office:  [phone]

## 2021-11-02 ENCOUNTER — Encounter: Payer: Self-pay | Admitting: Internal Medicine

## 2021-11-02 ENCOUNTER — Ambulatory Visit: Payer: BC Managed Care – PPO | Admitting: Internal Medicine

## 2021-11-02 ENCOUNTER — Ambulatory Visit (INDEPENDENT_AMBULATORY_CARE_PROVIDER_SITE_OTHER): Payer: BC Managed Care – PPO | Admitting: Internal Medicine

## 2021-11-02 VITALS — BP 104/60 | HR 77 | Temp 98.2°F | Ht 70.0 in | Wt 288.0 lb

## 2021-11-02 DIAGNOSIS — G4733 Obstructive sleep apnea (adult) (pediatric): Secondary | ICD-10-CM | POA: Diagnosis not present

## 2021-11-02 DIAGNOSIS — U099 Post covid-19 condition, unspecified: Secondary | ICD-10-CM

## 2021-11-02 DIAGNOSIS — R0602 Shortness of breath: Secondary | ICD-10-CM | POA: Diagnosis not present

## 2021-11-02 LAB — PULMONARY FUNCTION TEST
DL/VA % pred: 113 %
DL/VA: 5.04 ml/min/mmHg/L
DLCO cor % pred: 110 %
DLCO cor: 31.14 ml/min/mmHg
DLCO unc % pred: 110 %
DLCO unc: 31.14 ml/min/mmHg
FEF 25-75 Post: 3.45 L/sec
FEF 25-75 Pre: 3.43 L/sec
FEF2575-%Change-Post: 0 %
FEF2575-%Pred-Post: 103 %
FEF2575-%Pred-Pre: 103 %
FEV1-%Change-Post: 3 %
FEV1-%Pred-Post: 90 %
FEV1-%Pred-Pre: 87 %
FEV1-Post: 3.41 L
FEV1-Pre: 3.3 L
FEV1FVC-%Change-Post: 2 %
FEV1FVC-%Pred-Pre: 104 %
FEV6-%Change-Post: 0 %
FEV6-%Pred-Post: 87 %
FEV6-%Pred-Pre: 86 %
FEV6-Post: 4.1 L
FEV6-Pre: 4.07 L
FEV6FVC-%Change-Post: 0 %
FEV6FVC-%Pred-Post: 103 %
FEV6FVC-%Pred-Pre: 103 %
FVC-%Change-Post: 0 %
FVC-%Pred-Post: 84 %
FVC-%Pred-Pre: 83 %
FVC-Post: 4.1 L
FVC-Pre: 4.08 L
Post FEV1/FVC ratio: 83 %
Post FEV6/FVC ratio: 100 %
Pre FEV1/FVC ratio: 81 %
Pre FEV6/FVC Ratio: 100 %
RV % pred: 112 %
RV: 2.25 L
TLC % pred: 93 %
TLC: 6.34 L

## 2021-11-02 NOTE — Patient Instructions (Signed)
Follow up with me as needed. Your lung function is perfectly normal.  Complete the work up recommended by your heart doctor and follow up with sleep medicine to ensure your cpap is working well for your sleep apnea.  I think it's likely your sympoms are related to long covid infection.  See the website below for some resources on how to manage this chronic condition.   https://[email or web address].physio/resources

## 2021-11-02 NOTE — Progress Notes (Signed)
PFT done today. 

## 2021-11-02 NOTE — Progress Notes (Signed)
Stephen Webster    [id number]    10/17/1970  Primary Care Physician:Matthews, Colletta [place], NP Date of Appointment: 11/02/2021 Established Patient Visit  Chief complaint:   Chief Complaint  Patient presents with   Follow-up    Get PFT results.     HPI: Stephen Webster is a 51 y.o. man with persistent dyspnea and chest pain following covid infection in 2020.   Interval Updates: Still with persistent symptoms. Not sleeping well. Has follow up with Dr. Maxwell Caul next week. Has seen cardiology and is undergoing work up with CT and echocardiogram.  Inhalers have not been helpful.  Ongoing symptoms of fatigue, shortness of breath, poor sleep, decreased exercise tolerance, chest pain since covid infection.   I have reviewed the patient's family social and past medical history and updated as appropriate.   Past Medical History:  Diagnosis Date   Arthritis    Asthma    Hyperlipidemia    Hypertension    Obesity    Prediabetes    PUD (peptic ulcer disease)    Sleep apnea    Vitamin D deficiency     Past Surgical History:  Procedure Laterality Date   BACK SURGERY  2018   KNEE SURGERY     NECK SURGERY  2018    Family History  Problem Relation Age of Onset   Childhood respiratory disease Daughter     Social History   Occupational History   Not on file  Tobacco Use   Smoking status: Never   Smokeless tobacco: Never  Vaping Use   Vaping Use: Never used  Substance and Sexual Activity   Alcohol use: No   Drug use: No   Sexual activity: Not on file     Physical Exam: Blood pressure 104/60, pulse 77, temperature 98.2 F (36.8 C), temperature source Oral, height 5\' 10"  (1.778 m), weight 288 lb (130.6 kg), SpO2 99 %.  Gen:      No acute distress, obese, no distress ENT:  no nasal polyps, mucus membranes moist Lungs:    No increased respiratory effort, symmetric chest wall excursion, clear to auscultation bilaterally, no wheezes or crackles CV:          Regular rate and rhythm; no murmurs, rubs, or gallops.  No pedal edema   Data Reviewed: Imaging: I have personally reviewed the   PFTs:     Latest Ref Rng & Units 11/02/2021   11:57 AM  PFT Results  FVC-Pre L 4.08  P  FVC-Predicted Pre % 83  P  FVC-Post L 4.10  P  FVC-Predicted Post % 84  P  Pre FEV1/FVC % % 81  P  Post FEV1/FCV % % 83  P  FEV1-Pre L 3.30  P  FEV1-Predicted Pre % 87  P  FEV1-Post L 3.41  P  DLCO uncorrected ml/min/mmHg 31.14  P  DLCO UNC% % 110  P  DLCO corrected ml/min/mmHg 31.14  P  DLCO COR %Predicted % 110  P  DLVA Predicted % 113  P  TLC L 6.34  P  TLC % Predicted % 93  P  RV % Predicted % 112  P    P Preliminary result   I have personally reviewed the patient's PFTs and normal pulmonary function  Labs:  Immunization status: Immunization History  Administered Date(s) Administered   Influenza Split 02/15/2021   Influenza,inj,Quad PF,6+ Mos 01/12/2015, 11/15/2015   Influenza,inj,Quad PF,6-35 Mos 10/26/2019   PFIZER(Purple Top)SARS-COV-2 Vaccination 03/29/2019, 04/29/2019, 10/26/2019  Research officer, 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 Your lung function is perfectly normal.  Complete the work up recommended by your heart doctor and follow up with sleep medicine to ensure your cpap is working well for your sleep apnea.  I think it's likely your sympoms are related to long covid infection.  See the website below for some resources on how to manage this chronic condition.   https://longcovid.physio/resources   Return to Care: No follow-ups on file.   Durel Salts, MD Pulmonary and Critical Care Medicine [facility] Office:[phone]

## 2021-11-13 ENCOUNTER — Ambulatory Visit: Payer: BC Managed Care – PPO

## 2021-11-13 ENCOUNTER — Other Ambulatory Visit: Payer: Self-pay | Admitting: Cardiology

## 2021-11-13 DIAGNOSIS — R072 Precordial pain: Secondary | ICD-10-CM

## 2021-11-13 DIAGNOSIS — I209 Angina pectoris, unspecified: Secondary | ICD-10-CM

## 2021-11-19 ENCOUNTER — Other Ambulatory Visit: Payer: Self-pay | Admitting: Cardiology

## 2021-11-19 DIAGNOSIS — R7303 Prediabetes: Secondary | ICD-10-CM

## 2021-11-19 DIAGNOSIS — R072 Precordial pain: Secondary | ICD-10-CM

## 2021-11-19 DIAGNOSIS — I10 Essential (primary) hypertension: Secondary | ICD-10-CM

## 2021-11-19 DIAGNOSIS — E782 Mixed hyperlipidemia: Secondary | ICD-10-CM

## 2021-11-19 NOTE — Progress Notes (Signed)
His insurance did not approve a coronary CTA.  We will proceed with exercise nuclear stress test to evaluate for reversible ischemia given his precordial pain and multiple cardiovascular risk factors.    ICD-10-CM   1. Precordial pain  R07.2 PCV MYOCARDIAL PERFUSION WO LEXISCAN    2. Benign hypertension  I10 PCV MYOCARDIAL PERFUSION WO LEXISCAN    3. Mixed hyperlipidemia  E78.2 PCV MYOCARDIAL PERFUSION WO LEXISCAN    4. Prediabetes  R73.03 PCV MYOCARDIAL PERFUSION WO LEXISCAN      Orders Placed This Encounter  Procedures   PCV MYOCARDIAL PERFUSION WO LEXISCAN    Standing Status:   Future    Standing Expiration Date:   11/20/2022   Stephen Webster [facility]  Pager: [phone] Office: [phone]

## 2021-11-27 ENCOUNTER — Ambulatory Visit: Payer: BC Managed Care – PPO | Admitting: Cardiology

## 2021-12-01 DIAGNOSIS — E785 Hyperlipidemia, unspecified: Secondary | ICD-10-CM | POA: Diagnosis not present

## 2021-12-01 DIAGNOSIS — I10 Essential (primary) hypertension: Secondary | ICD-10-CM | POA: Diagnosis not present

## 2021-12-01 DIAGNOSIS — E1165 Type 2 diabetes mellitus with hyperglycemia: Secondary | ICD-10-CM | POA: Diagnosis not present

## 2021-12-01 DIAGNOSIS — G4733 Obstructive sleep apnea (adult) (pediatric): Secondary | ICD-10-CM | POA: Diagnosis not present

## 2021-12-11 ENCOUNTER — Ambulatory Visit: Payer: BC Managed Care – PPO

## 2021-12-11 DIAGNOSIS — R072 Precordial pain: Secondary | ICD-10-CM | POA: Diagnosis not present

## 2021-12-11 DIAGNOSIS — E782 Mixed hyperlipidemia: Secondary | ICD-10-CM | POA: Diagnosis not present

## 2021-12-11 DIAGNOSIS — R7303 Prediabetes: Secondary | ICD-10-CM

## 2021-12-11 DIAGNOSIS — I10 Essential (primary) hypertension: Secondary | ICD-10-CM | POA: Diagnosis not present

## 2021-12-13 NOTE — Progress Notes (Signed)
Called and spoke with patient regarding his stress test results.

## 2022-01-09 ENCOUNTER — Encounter: Payer: Self-pay | Admitting: Cardiology

## 2022-01-09 ENCOUNTER — Ambulatory Visit: Payer: BC Managed Care – PPO | Admitting: Cardiology

## 2022-01-09 VITALS — BP 145/81 | HR 68 | Resp 18 | Ht 70.0 in | Wt 293.0 lb

## 2022-01-09 DIAGNOSIS — R7303 Prediabetes: Secondary | ICD-10-CM

## 2022-01-09 DIAGNOSIS — R072 Precordial pain: Secondary | ICD-10-CM | POA: Diagnosis not present

## 2022-01-09 DIAGNOSIS — I10 Essential (primary) hypertension: Secondary | ICD-10-CM

## 2022-01-09 DIAGNOSIS — E782 Mixed hyperlipidemia: Secondary | ICD-10-CM | POA: Diagnosis not present

## 2022-01-09 DIAGNOSIS — G4733 Obstructive sleep apnea (adult) (pediatric): Secondary | ICD-10-CM

## 2022-01-09 NOTE — Progress Notes (Signed)
ID:  Stephen Webster, DOB 03-19-1970, MRN [id number]  PCP:  Faustino Congress, NP  Cardiologist:  Rex Kras, DO, [facility] (established care 05/24/2021)  Date: 01/09/22 Last Office Visit: 10/26/2021  Chief Complaint  Patient presents with   Follow-up    4 weeks Re-evaluation of chest pain    HPI  Stephen Webster is a 51 y.o. [place] male whose past medical history and cardiovascular risk factors include: Hypertension, hyperlipidemia, prediabetes, history of peptic ulcer disease, sleep apnea on CPAP, obesity due to excess calories.   Patient was referred to the practice for evaluation of chest pain.  Accompanied by his wife. Initially the symptoms are concerning for possible ACS however he was recommended to undergo angiography however, he was lost to follow-up.  He presented to the office due to continued concerns for precordial pain.  Since last office visit he did undergo an echocardiogram and stress test.  Results reviewed with both the patient and his wife.  He recently returned back from a trip to [place] during which time he did not have any anginal discomfort.  His chest pain is resolved.  He has gained approximately 5 pounds last office visit likely due to increased caloric intake during his vacation.  Patient states that his home blood pressures are 120 mmHg.  FUNCTIONAL STATUS: No structured exercise program or daily routine.  ALLERGIES: Allergies  Allergen Reactions   Aspirin Other (See Comments)    Patient has an ulcer (low dose is tolerable)   Nsaids     Abdominal pain    MEDICATION LIST PRIOR TO VISIT: Current Meds  Medication Sig   albuterol (VENTOLIN HFA) 108 (90 Base) MCG/ACT inhaler Inhale 2 puffs into the lungs every 4 (four) hours as needed for wheezing or shortness of breath.   diphenhydrAMINE (BENADRYL) 25 MG tablet Take 1 tablet (25 mg total) by mouth every 6 (six) hours. (Patient taking differently: Take 25 mg by mouth daily as needed for allergies.)    fluticasone (FLOVENT HFA) 110 MCG/ACT inhaler Inhale 1 puff into the lungs 2 (two) times daily as needed (respiratory issues.).   lisinopril (ZESTRIL) 5 MG tablet Take 5 mg by mouth every morning.   loratadine (CLARITIN) 10 MG tablet Take 10 mg by mouth daily as needed for allergies.   montelukast (SINGULAIR) 10 MG tablet Take 10 mg by mouth every evening.   nitroGLYCERIN (NITROSTAT) 0.4 MG SL tablet Place 1 tablet (0.4 mg total) under the tongue every 5 (five) minutes as needed for chest pain. If you require more than two tablets five minutes apart go to the nearest ER via EMS.   rosuvastatin (CRESTOR) 20 MG tablet Take 20 mg by mouth daily.   [DISCONTINUED] aspirin EC 81 MG tablet TAKE 1 TABLET BY MOUTH EVERY DAY *SWALLOW WHOLE*   [DISCONTINUED] metoprolol succinate (TOPROL-XL) 25 MG 24 hr tablet TAKE 1 TABLET (25 MG TOTAL) BY MOUTH DAILY.     PAST MEDICAL HISTORY: Past Medical History:  Diagnosis Date   Arthritis    Asthma    Hyperlipidemia    Hypertension    Obesity    Prediabetes    PUD (peptic ulcer disease)    Sleep apnea    Vitamin D deficiency     PAST SURGICAL HISTORY: Past Surgical History:  Procedure Laterality Date   BACK SURGERY  2018   KNEE SURGERY     NECK SURGERY  2018    FAMILY HISTORY: The patient family history includes Childhood respiratory disease in his daughter.  SOCIAL HISTORY:  The patient  reports that he has never smoked. He has never used smokeless tobacco. He reports that he does not drink alcohol and does not use drugs.  REVIEW OF SYSTEMS: Review of Systems  Constitutional: Positive for weight gain.  Cardiovascular:  Negative for chest pain, claudication, dyspnea on exertion, irregular heartbeat, leg swelling, near-syncope, orthopnea, palpitations, paroxysmal nocturnal dyspnea and syncope.  Respiratory:  Negative for shortness of breath.   Hematologic/Lymphatic: Negative for bleeding problem.  Musculoskeletal:  Negative for muscle cramps and  myalgias.  Neurological:  Negative for dizziness and light-headedness.    PHYSICAL EXAM:    01/09/2022    3:27 PM 11/02/2021    1:04 PM 10/26/2021    1:31 PM  Vitals with BMI  Height _0  _1  _2   Weight 293 lbs 288 lbs 276 lbs  BMI 42.04 24.23 53.6  Systolic 144 315 400  Diastolic 81 60 81  Pulse 68 77 61    Physical Exam  Constitutional: No distress.  Age appropriate, hemodynamically stable.   Neck: No JVD present.  Cardiovascular: Normal rate, regular rhythm, S1 normal, S2 normal, intact distal pulses and normal pulses. Exam reveals no gallop, no S3 and no S4.  No murmur heard. Pulmonary/Chest: Effort normal and breath sounds normal. No stridor. He has no wheezes. He has no rales.  Abdominal: Soft. Bowel sounds are normal. He exhibits no distension. There is no abdominal tenderness.  Obese  Musculoskeletal:        General: No edema.     Cervical back: Neck supple.  Neurological: He is alert and oriented to person, place, and time. He has intact cranial nerves (2-12).  Skin: Skin is warm and moist.   CARDIAC DATABASE: EKG: 01/09/2022: Normal sinus rhythm, 70 bpm, left axis, TWI consider inferior and anterior ischemia.  Echocardiogram: 11/13/2021: Normal LV systolic function with visual EF 60-65%. Left ventricle cavity is normal in size. Normal left ventricular wall thickness. Normal global wall motion. Normal diastolic filling pattern, normal LAP.  Mild pulmonic regurgitation. No prior study for comparison.    Stress Testing: Exercise nuclear stress test 12/11/2021 Myocardial perfusion is normal. Apical thinning is present. Overall LV systolic function is normal without regional wall motion abnormalities. Stress LV EF: 57%. Low risk study. Normal ECG stress. The patient exercised for 9 minutes and 0 seconds of a Bruce protocol, achieving approximately 10.16 METs and 85% MPHR. The heart rate response was normal. The blood pressure response was normal. No  previous exam available for comparison.    Heart Catheterization: None  LABORATORY DATA:    Latest Ref Rng & Units 09/27/2021    8:46 AM 05/24/2021   10:46 AM 08/01/2018   12:41 AM  CBC  WBC 3.4 - 10.8 x10E3/uL 7.3  7.2  6.0   Hemoglobin 13.0 - 17.7 g/dL 14.6  14.7  14.8   Hematocrit 37.5 - 51.0 % 43.6  42.4  44.6   Platelets 150 - 450 x10E3/uL 158  160  192        Latest Ref Rng & Units 09/27/2021    8:46 AM 05/24/2021   10:46 AM 08/01/2018   12:41 AM  CMP  Glucose 70 - 99 mg/dL 106  82  112   BUN 6 - 24 mg/dL _3 Creatinine 0.76 - 1.27 mg/dL 1.26  1.18  1.41   Sodium 134 - 144 mmol/L 142  140  132   Potassium 3.5 - 5.2 mmol/L  4.3  4.4  4.1   Chloride 96 - 106 mmol/L 105  103  97   CO2 20 - 29 mmol/L _0 Calcium 8.7 - 10.2 mg/dL 9.6  9.0  8.6     No components found for: "NTPROBNP" Recent Labs    05/24/21 1047 09/27/21 0846  PROBNP <36 111   No results for input(s): "TSH" in the last 8760 hours.  BMP Recent Labs    05/24/21 1046 09/27/21 0846  NA 140 142  K 4.4 4.3  CL 103 105  CO2 24 24  GLUCOSE 82 106*  BUN 20 18  CREATININE 1.18 1.26  CALCIUM 9.0 9.6    HEMOGLOBIN A1C No results found for: "HGBA1C", "MPG"  External Labs:  Date Collected: 04/29/2020 , information obtained by referring physician. Potassium: 4.4 Creatinine 1.12 mg/dL. eGFR: 80 mL/min per 1.73 m Lipid profile: Total cholesterol 130 , triglycerides 49 , HDL 36 , LDL 82, non-HDL 93 AST: 23 , ALT: 33 , alkaline phosphatase: 19  Hemoglobin A1c: 5.7  IMPRESSION:    ICD-10-CM   1. Precordial pain  R07.2 EKG 12-Lead    2. Benign hypertension  I10     3. Mixed hyperlipidemia  E78.2     4. Prediabetes  R73.03     5. OSA on CPAP  G47.33     6. Class 3 severe obesity due to excess calories with serious comorbidity and body mass index (BMI) of 40.0 to 44.9 in adult [facility])  E66.01    Z68.41        RECOMMENDATIONS: Ulisses Vondrak is a 51 y.o. [place] male whose past  medical history and cardiac risk factors include: Hypertension, hyperlipidemia, prediabetes, history of peptic ulcer disease, sleep apnea on CPAP, obesity due to excess calories.   Precordial pain Asymptomatic. Has undergone echo and stress test since last office visit. Echo: Preserved LVEF, normal diastolic function, no significant valvular heart disease. Exercise nuclear stress test good functional capacity for age, normal myocardial perfusion with apical thinning. Educated on the importance of improving his modifiable cardiovascular risk factors: Blood pressure management, glycemic control, weight loss, lipid management. Discontinue aspirin 81 mg p.o. daily. Discontinue Lopressor  Benign hypertension Office blood pressures are not well-controlled. Patient states home blood pressures are better controlled. Has to keep a log of his blood pressures and to review it with his PCP to see if additional medication titration is warranted Low-salt diet recommended Increase physical activity as tolerated with goal of 30 minutes a day 5 days a week  Mixed hyperlipidemia Currently on Crestor.   He denies myalgia or other side effects. Currently managed by primary care provider.  OSA on CPAP Reemphasized the importance of CPAP compliance. Currently managed by Dr. Nehemiah Settle.  No additional testing warranted from a cardiovascular standpoint.  Would like to see him back on an annual basis or as needed.  Plan of care as well as results discussed with both the patient and his wife at today's office visit.  FINAL MEDICATION LIST END OF ENCOUNTER: No orders of the defined types were placed in this encounter.   Medications Discontinued During This Encounter  Medication Reason   aspirin EC 81 MG tablet    metoprolol succinate (TOPROL-XL) 25 MG 24 hr tablet       Current Outpatient Medications:    albuterol (VENTOLIN HFA) 108 (90 Base) MCG/ACT inhaler, Inhale 2 puffs into the lungs every 4  (four) hours as needed for wheezing or  shortness of breath., Disp: , Rfl:    diphenhydrAMINE (BENADRYL) 25 MG tablet, Take 1 tablet (25 mg total) by mouth every 6 (six) hours. (Patient taking differently: Take 25 mg by mouth daily as needed for allergies.), Disp: 20 tablet, Rfl: 0   fluticasone (FLOVENT HFA) 110 MCG/ACT inhaler, Inhale 1 puff into the lungs 2 (two) times daily as needed (respiratory issues.)., Disp: , Rfl:    lisinopril (ZESTRIL) 5 MG tablet, Take 5 mg by mouth every morning., Disp: , Rfl:    loratadine (CLARITIN) 10 MG tablet, Take 10 mg by mouth daily as needed for allergies., Disp: , Rfl:    montelukast (SINGULAIR) 10 MG tablet, Take 10 mg by mouth every evening., Disp: , Rfl:    nitroGLYCERIN (NITROSTAT) 0.4 MG SL tablet, Place 1 tablet (0.4 mg total) under the tongue every 5 (five) minutes as needed for chest pain. If you require more than two tablets five minutes apart go to the nearest ER via EMS., Disp: 30 tablet, Rfl: 0   rosuvastatin (CRESTOR) 20 MG tablet, Take 20 mg by mouth daily., Disp: , Rfl:   Orders Placed This Encounter  Procedures   EKG 12-Lead    There are no Patient Instructions on file for this visit.   --Continue cardiac medications as reconciled in final medication list. --Return in about 1 year (around 01/10/2023), or if symptoms worsen or fail to improve, for Annual follow up visit. Or sooner if needed. --Continue follow-up with your primary care physician regarding the management of your other chronic comorbid conditions.  Patient's questions and concerns were addressed to his satisfaction. He voices understanding of the instructions provided during this encounter.   This note was created using a voice recognition software as a result there may be grammatical errors inadvertently enclosed that do not reflect the nature of this encounter. Every attempt is made to correct such errors.  Rex Kras, [place], [facility]  Pager: [phone] Office:  [phone]

## 2022-02-11 ENCOUNTER — Other Ambulatory Visit: Payer: Self-pay | Admitting: Cardiology

## 2022-02-11 DIAGNOSIS — I209 Angina pectoris, unspecified: Secondary | ICD-10-CM

## 2022-02-15 ENCOUNTER — Other Ambulatory Visit: Payer: Self-pay | Admitting: Cardiology

## 2022-02-15 DIAGNOSIS — I209 Angina pectoris, unspecified: Secondary | ICD-10-CM

## 2022-02-20 ENCOUNTER — Other Ambulatory Visit: Payer: Self-pay | Admitting: Cardiology

## 2022-02-20 DIAGNOSIS — I209 Angina pectoris, unspecified: Secondary | ICD-10-CM

## 2022-03-27 DIAGNOSIS — B349 Viral infection, unspecified: Secondary | ICD-10-CM | POA: Diagnosis not present

## 2022-03-27 DIAGNOSIS — R051 Acute cough: Secondary | ICD-10-CM | POA: Diagnosis not present

## 2022-03-27 DIAGNOSIS — R6883 Chills (without fever): Secondary | ICD-10-CM | POA: Diagnosis not present

## 2022-03-27 DIAGNOSIS — R52 Pain, unspecified: Secondary | ICD-10-CM | POA: Diagnosis not present

## 2022-03-27 DIAGNOSIS — J029 Acute pharyngitis, unspecified: Secondary | ICD-10-CM | POA: Diagnosis not present

## 2022-03-27 DIAGNOSIS — J40 Bronchitis, not specified as acute or chronic: Secondary | ICD-10-CM | POA: Diagnosis not present

## 2022-06-13 ENCOUNTER — Other Ambulatory Visit: Payer: Self-pay | Admitting: Cardiology

## 2022-06-13 DIAGNOSIS — I209 Angina pectoris, unspecified: Secondary | ICD-10-CM

## 2022-08-31 DIAGNOSIS — E1165 Type 2 diabetes mellitus with hyperglycemia: Secondary | ICD-10-CM | POA: Diagnosis not present

## 2022-08-31 DIAGNOSIS — I10 Essential (primary) hypertension: Secondary | ICD-10-CM | POA: Diagnosis not present

## 2022-09-03 DIAGNOSIS — E785 Hyperlipidemia, unspecified: Secondary | ICD-10-CM | POA: Diagnosis not present

## 2022-09-03 DIAGNOSIS — E1165 Type 2 diabetes mellitus with hyperglycemia: Secondary | ICD-10-CM | POA: Diagnosis not present

## 2022-09-03 DIAGNOSIS — J4540 Moderate persistent asthma, uncomplicated: Secondary | ICD-10-CM | POA: Diagnosis not present

## 2022-09-03 DIAGNOSIS — I10 Essential (primary) hypertension: Secondary | ICD-10-CM | POA: Diagnosis not present

## 2023-04-11 DIAGNOSIS — E1165 Type 2 diabetes mellitus with hyperglycemia: Secondary | ICD-10-CM | POA: Diagnosis not present

## 2023-04-11 DIAGNOSIS — J4540 Moderate persistent asthma, uncomplicated: Secondary | ICD-10-CM | POA: Diagnosis not present

## 2023-04-11 DIAGNOSIS — Z125 Encounter for screening for malignant neoplasm of prostate: Secondary | ICD-10-CM | POA: Diagnosis not present

## 2023-04-11 DIAGNOSIS — Z0001 Encounter for general adult medical examination with abnormal findings: Secondary | ICD-10-CM | POA: Diagnosis not present

## 2023-04-11 DIAGNOSIS — E785 Hyperlipidemia, unspecified: Secondary | ICD-10-CM | POA: Diagnosis not present

## 2023-04-11 DIAGNOSIS — I10 Essential (primary) hypertension: Secondary | ICD-10-CM | POA: Diagnosis not present

## 2023-04-11 DIAGNOSIS — Z23 Encounter for immunization: Secondary | ICD-10-CM | POA: Diagnosis not present
